# Patient Record
Sex: Male | Born: 1970
Health system: Southern US, Community
[De-identification: ages and names within clinical notes are randomized; demographics above are authoritative.]

## PROBLEM LIST (undated history)

## (undated) DIAGNOSIS — E079 Disorder of thyroid, unspecified: Secondary | ICD-10-CM

## (undated) DIAGNOSIS — M1990 Unspecified osteoarthritis, unspecified site: Secondary | ICD-10-CM

## (undated) DIAGNOSIS — T7840XA Allergy, unspecified, initial encounter: Secondary | ICD-10-CM

## (undated) DIAGNOSIS — E785 Hyperlipidemia, unspecified: Secondary | ICD-10-CM

## (undated) DIAGNOSIS — I10 Essential (primary) hypertension: Secondary | ICD-10-CM

## (undated) DIAGNOSIS — E039 Hypothyroidism, unspecified: Secondary | ICD-10-CM

## (undated) HISTORY — DX: Disorder of thyroid, unspecified: E07.9

## (undated) HISTORY — PX: FINGER FRACTURE SURGERY: SHX638

## (undated) HISTORY — DX: Hyperlipidemia, unspecified: E78.5

## (undated) HISTORY — DX: Allergy, unspecified, initial encounter: T78.40XA

## (undated) HISTORY — DX: Essential (primary) hypertension: I10

---

## 2004-11-17 ENCOUNTER — Ambulatory Visit: Payer: Self-pay | Admitting: Internal Medicine

## 2005-02-17 ENCOUNTER — Ambulatory Visit: Payer: Self-pay | Admitting: Internal Medicine

## 2005-12-12 ENCOUNTER — Ambulatory Visit: Payer: Self-pay | Admitting: Internal Medicine

## 2006-03-13 ENCOUNTER — Ambulatory Visit: Payer: Self-pay | Admitting: Internal Medicine

## 2006-05-31 ENCOUNTER — Ambulatory Visit: Payer: Self-pay | Admitting: Internal Medicine

## 2006-09-22 ENCOUNTER — Ambulatory Visit: Payer: Self-pay | Admitting: Internal Medicine

## 2007-12-11 ENCOUNTER — Ambulatory Visit: Payer: Self-pay | Admitting: Internal Medicine

## 2007-12-11 DIAGNOSIS — B359 Dermatophytosis, unspecified: Secondary | ICD-10-CM | POA: Insufficient documentation

## 2007-12-11 DIAGNOSIS — E785 Hyperlipidemia, unspecified: Secondary | ICD-10-CM | POA: Insufficient documentation

## 2007-12-11 DIAGNOSIS — E039 Hypothyroidism, unspecified: Secondary | ICD-10-CM | POA: Insufficient documentation

## 2008-03-04 ENCOUNTER — Ambulatory Visit: Payer: Self-pay | Admitting: Internal Medicine

## 2009-06-18 ENCOUNTER — Telehealth: Payer: Self-pay | Admitting: Internal Medicine

## 2009-07-01 ENCOUNTER — Ambulatory Visit: Payer: Self-pay | Admitting: Internal Medicine

## 2009-07-03 LAB — CONVERTED CEMR LAB
Albumin: 4.1 g/dL (ref 3.5–5.2)
Basophils Absolute: 0 10*3/uL (ref 0.0–0.1)
Calcium: 9.2 mg/dL (ref 8.4–10.5)
Chloride: 108 meq/L (ref 96–112)
Eosinophils Absolute: 0.1 10*3/uL (ref 0.0–0.7)
HCT: 40.2 % (ref 39.0–52.0)
Hemoglobin: 13.7 g/dL (ref 13.0–17.0)
Lymphs Abs: 1.1 10*3/uL (ref 0.7–4.0)
MCHC: 34.1 g/dL (ref 30.0–36.0)
Monocytes Relative: 6.7 % (ref 3.0–12.0)
Neutro Abs: 2.9 10*3/uL (ref 1.4–7.7)
Potassium: 4.5 meq/L (ref 3.5–5.1)
RDW: 12.6 % (ref 11.5–14.6)

## 2010-10-04 ENCOUNTER — Ambulatory Visit: Payer: Self-pay | Admitting: Internal Medicine

## 2010-10-04 DIAGNOSIS — J309 Allergic rhinitis, unspecified: Secondary | ICD-10-CM | POA: Insufficient documentation

## 2010-10-05 LAB — CONVERTED CEMR LAB
Cholesterol: 198 mg/dL (ref 0–200)
Glucose, Bld: 86 mg/dL (ref 70–99)
HDL: 62 mg/dL (ref >39.00)
Total CHOL/HDL Ratio: 3
Triglycerides: 45 mg/dL (ref 0.0–149.0)

## 2010-11-25 NOTE — Assessment & Plan Note (Signed)
Summary: CPX/REFILL MED/CLE   Vital Signs:  Patient profile:   40 year old male Height:      69 inches Weight:      216 pounds BMI:     32.01 Temp:     98.9 degrees F oral Pulse rate:   60 / minute Pulse rhythm:   regular BP sitting:   128 / 80  (left arm) Cuff size:   large  Vitals Entered By: Mervin Hack CMA Duncan Dull) (October 04, 2010 11:45 AM) CC: adult physical   History of Present Illness: Doing well Feels fine--no new complaints Does get some aches and pains  Now in new tire shop  better conditions  Allergies: No Known Drug Allergies  Past History:  Past medical, surgical, family and social histories (including risk factors) reviewed for relevance to current acute and chronic problems.  Past Medical History: Hyperlipidemia Hypothyroidism Allergic rhinitis  Past Surgical History: Reviewed history from 12/11/2007 and no changes required. 1980's Rgiht 2nd/3rd finger repair  Family History: Reviewed history from 12/11/2007 and no changes required. Dad died @56  Alcoholism, HTN Mom died @54  lymphoma 1 brother, 1 sister No CAD or DM No prostate or colon cancer  Social History: Occupation:  Retail buyer Married--2nd.  1 child, joint custody with mom Former Smoker Alcohol use-occ  Review of Systems General:  weight had been down and then back up again after Thanksgiving Has started jogging--has run 5K Not that careful with diet but has cut down on junk food doesn't generally drink calories Sleeps okay wears seat belt. Eyes:  Denies double vision and vision loss-1 eye. ENT:  Denies decreased hearing and ringing in ears; teeth okay---overdue for dentist. CV:  Denies chest pain or discomfort, difficulty breathing at night, difficulty breathing while lying down, fainting, lightheadness, palpitations, and shortness of breath with exertion. Resp:  Denies cough and shortness of breath. GI:  Denies abdominal pain, bloody stools, change in bowel  habits, dark tarry stools, indigestion, nausea, and vomiting. GU:  Denies erectile dysfunction, urinary frequency, and urinary hesitancy. MS:  Complains of muscle aches; denies joint pain and joint swelling; mild achiness. Derm:  Denies lesion(s) and rash. Neuro:  Complains of headaches; denies numbness and tingling; only occ headaches. Psych:  Denies anxiety and depression. Endo:  Denies cold intolerance and heat intolerance. Heme:  Denies abnormal bruising and enlarge lymph nodes. Allergy:  Complains of seasonal allergies and sneezing; gets post nasal drip and clears his throat does take meds regularly.  Physical Exam  General:  alert and normal appearance.   Eyes:  pupils equal, pupils round, pupils reactive to light, and no optic disk abnormalities.   Ears:  R ear normal and L ear normal.   Mouth:  no erythema, no exudates, and no lesions.   Neck:  supple, no masses, no thyromegaly, no carotid bruits, and no cervical lymphadenopathy.   Lungs:  normal respiratory effort, no intercostal retractions, no accessory muscle use, and normal breath sounds.   Heart:  normal rate, regular rhythm, no murmur, and no gallop.   Abdomen:  soft, non-tender, and no masses.   Msk:  no joint tenderness and no joint swelling.   Pulses:  1+ in feet Extremities:  no edema Neurologic:  alert & oriented X3, strength normal in all extremities, and gait normal.   Skin:  no rashes and no suspicious lesions.   Several benign nevi Axillary Nodes:  No palpable lymphadenopathy Psych:  normally interactive, good eye contact, not anxious appearing, and not  depressed appearing.     Impression & Recommendations:  Problem # 1:  PREVENTIVE HEALTH CARE (ICD-V70.0) Assessment Comment Only discussed fitness--needs to get back to running Due for Tdap check glucose  Problem # 2:  ALLERGIC RHINITIS (ICD-477.9) Assessment: Comment Only uses OTC meds as needed  will add fluticasone as needed   Problem # 3:   HYPERLIPIDEMIA (ICD-272.4) Assessment: Comment Only  will recheck no meds though  Orders: TLB-Lipid Panel (80061-LIPID)  Problem # 4:  HYPOTHYROIDISM (ICD-244.9) Assessment: Comment Only  due for labs  The following medications were removed from the medication list:    Levothyroxine Sodium 150 Mcg Tabs (Levothyroxine sodium) .[facility]... 1 tab daily His updated medication list for this problem includes:    Synthroid 150 Mcg Tabs (Levothyroxine sodium) .Marland Kitchen... Take 1 tablet by mouth once a day  Orders: TLB-TSH (Thyroid Stimulating Hormone) (84443-TSH) TLB-Glucose, QUANT (82947-GLU) TLB-T4 (Thyrox), Free [phone]) Venipuncture (13244)  Complete Medication List: 1)  Synthroid 150 Mcg Tabs (Levothyroxine sodium) .... Take 1 tablet by mouth once a day  Other Orders: Tdap => 41yrs IM (01027) Admin 1st Vaccine (25366)  Patient Instructions: 1)  Please schedule a follow-up appointment in 1 year for physical Prescriptions: SYNTHROID 150 MCG  TABS (LEVOTHYROXINE SODIUM) Take 1 tablet by mouth once a day  #90 x 3   Entered by:   Mervin Hack CMA (AAMA)   Authorized by:   Cindee Salt MD   Signed by:   Mervin Hack CMA (AAMA) on 10/04/2010   Method used:   [facility]        [facility]       [address]       [place], [place]  [address]       Ph: [phone]       Fax: [phone]   RxID:   [id number]    Orders Added: 1)  Est. Patient 40-64 years [99396] 2)  TLB-TSH (Thyroid Stimulating Hormone) [84443-TSH] 3)  TLB-Glucose, QUANT [82947-GLU] 4)  TLB-T4 (Thyrox), Free [23557-DU2G] 5)  Venipuncture [25427] 6)  TLB-Lipid Panel [80061-LIPID] 7)  Tdap => 68yrs IM [90715] 8)  Admin 1st Vaccine [06237]   Immunizations Administered:  Tetanus Vaccine:    Vaccine Type: Tdap    Site: left deltoid    Mfr: GlaxoSmithKline    Dose: 0.5 ml    Route: IM    Given by: Mervin Hack CMA (AAMA)    Exp.  Date: 08/12/2012    Lot #: [id number]    VIS given: 09/10/08 version given October 04, 2010.   Immunizations Administered:  Tetanus Vaccine:    Vaccine Type: Tdap    Site: left deltoid    Mfr: GlaxoSmithKline    Dose: 0.5 ml    Route: IM    Given by: Mervin Hack CMA (AAMA)    Exp. Date: 08/12/2012    Lot #: [id number]    VIS given: 09/10/08 version given October 04, 2010.  Current Allergies (reviewed today): No known allergies

## 2011-08-09 ENCOUNTER — Encounter: Payer: Self-pay | Admitting: Internal Medicine

## 2011-08-10 ENCOUNTER — Encounter: Payer: Self-pay | Admitting: Family Medicine

## 2011-08-10 ENCOUNTER — Ambulatory Visit (INDEPENDENT_AMBULATORY_CARE_PROVIDER_SITE_OTHER): Payer: BC Managed Care – PPO | Admitting: Family Medicine

## 2011-08-10 VITALS — BP 138/100 | HR 68 | Temp 98.0°F | Ht 68.5 in | Wt 222.8 lb

## 2011-08-10 DIAGNOSIS — I10 Essential (primary) hypertension: Secondary | ICD-10-CM

## 2011-08-10 DIAGNOSIS — E039 Hypothyroidism, unspecified: Secondary | ICD-10-CM

## 2011-08-10 LAB — BASIC METABOLIC PANEL
BUN: 17 mg/dL (ref 6–23)
CO2: 29 mEq/L (ref 19–32)
Chloride: 104 mEq/L (ref 96–112)
Creatinine, Ser: 0.9 mg/dL (ref 0.4–1.5)
Glucose, Bld: 87 mg/dL (ref 70–99)

## 2011-08-10 LAB — T4, FREE: Free T4: 1.12 ng/dL (ref 0.60–1.60)

## 2011-08-10 MED ORDER — HYDROCHLOROTHIAZIDE 12.5 MG PO CAPS: 12.5000 mg | ORAL_CAPSULE | Freq: Every day | ORAL | Status: DC

## 2011-08-10 NOTE — Assessment & Plan Note (Addendum)
New dx.  anticipate due to stress, increased salt recently. Discussed lifestyle changes, importance of weight loss. Check TSH today. EKG today for baseline - NSR 66 rate.  Nl axis, intervals.  RSR in V1 and V2 but criteria not met for bundle.  No acute ST/T changes. Start low dose diuretic, rtc 1 mo with PCP for f/u. Update if consistently running elevated.

## 2011-08-10 NOTE — Progress Notes (Signed)
  Subjective:    Patient ID: Victor Ford, male    DOB: September 18, 1971, 40 y.o.   MRN: [id number]  HPI CC: BP elevated.  40 yo with h/o HLD and hypothyroidism here today because elevated blood pressure at pre-work physical - yesterday 170/110, on recheck stayed elevated like that, max for work was 160/100.  Has never had elevated blood pressure.  Thinks combination of coffee that morning, eggs with salt, stress (put in 2 wks notice, but actually told not to return so currently without job).  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  Thinks has had elevated salt in diet recently as well.  No HA, vision changes, CP/tightness, SOB, leg swelling, urinary changes.  BP Readings from Last 3 Encounters:  08/10/11 140/100  10/04/10 128/80  07/01/09 110/80   Wt Readings from Last 3 Encounters:  08/10/11 222 lb 12 oz (101.039 kg)  10/04/10 216 lb (97.977 kg)  07/01/09 215 lb (97.523 kg)  has been 205lbs in past, as high as 300lbs in past.  Never HTN with this either.  Family History  Problem Relation Age of Onset  . Alcohol abuse Father   . Hypertension Father   . Diabetes Neg Hx   . Cancer Neg Hx   . Heart disease Neg Hx    Review of Systems Per HPI    Objective:   Physical Exam  Nursing note and vitals reviewed. Constitutional: He appears well-developed and well-nourished. No distress.  HENT:  Head: Normocephalic and atraumatic.  Mouth/Throat: Oropharynx is clear and moist. No oropharyngeal exudate.  Eyes: Conjunctivae and EOM are normal. Pupils are equal, round, and reactive to light. No scleral icterus.  Neck: Normal range of motion. Neck supple. Carotid bruit is not present.  Cardiovascular: Normal rate, regular rhythm, normal heart sounds and intact distal pulses.   No murmur heard. Pulmonary/Chest: Breath sounds normal. No respiratory distress. He has no wheezes. He has no rales.  Abdominal: Soft. Bowel sounds are normal. There is no tenderness.     No abd/renal bruits  Skin: Skin is warm and dry. No rash noted.      Assessment & Plan:

## 2011-08-10 NOTE — Patient Instructions (Addendum)
I'd like you to keep track of blood pressure for next several weeks (1-2x/wk).  Goal is <140/90.  If running consistently high, let us know. Back off salt (goal sodium 1500mg /day). More fruits and vegetables for potassium, more water in diet. Slowly start exercising more - weight loss will help with this as well. EKG done today. Start hydrochlorothiazide once daily (water pill), return to see [place] in 1 month. Form filled out today.

## 2011-08-10 NOTE — Assessment & Plan Note (Signed)
Check TSH.  Last check elevated but normal free T4. Lab Results  Component Value Date   TSH 8.71* 10/04/2010

## 2011-08-11 ENCOUNTER — Telehealth: Payer: Self-pay | Admitting: Family Medicine

## 2011-08-11 MED ORDER — LEVOTHYROXINE SODIUM 175 MCG PO TABS: 175.0000 ug | ORAL_TABLET | Freq: Every day | ORAL | Status: DC

## 2011-08-11 NOTE — Telephone Encounter (Signed)
please notify kidneys, sugar normal. Thyroid slightly low, would recommend increasing slightly - have sent in new dose to pharmacy. May take extra 1/2 pill of synthroid once a week until runs out, then fill new dose of daily. Make appt in 1-2 mo with PCP, would want to recheck TSH next visit and follow BP.

## 2011-08-11 NOTE — Telephone Encounter (Signed)
Patient notified and will make change in synthroid. He will start new Rx once he runs out of what he currently has. He will call back to schedule a follow up because he doesn't know what his schedule will be like at that time due to new job.

## 2011-12-08 ENCOUNTER — Encounter: Payer: Self-pay | Admitting: Internal Medicine

## 2011-12-08 ENCOUNTER — Ambulatory Visit (INDEPENDENT_AMBULATORY_CARE_PROVIDER_SITE_OTHER): Payer: PRIVATE HEALTH INSURANCE | Admitting: Internal Medicine

## 2011-12-08 VITALS — BP 128/80 | HR 60 | Temp 98.0°F | Ht 68.5 in | Wt 222.0 lb

## 2011-12-08 DIAGNOSIS — I10 Essential (primary) hypertension: Secondary | ICD-10-CM

## 2011-12-08 LAB — BASIC METABOLIC PANEL
BUN: 17 mg/dL (ref 6–23)
CO2: 30 mEq/L (ref 19–32)
Calcium: 9.3 mg/dL (ref 8.4–10.5)
Creatinine, Ser: 0.9 mg/dL (ref 0.4–1.5)
Glucose, Bld: 82 mg/dL (ref 70–99)

## 2011-12-08 MED ORDER — HYDROCHLOROTHIAZIDE 12.5 MG PO CAPS: 12.5000 mg | ORAL_CAPSULE | Freq: Every day | ORAL | Status: DC

## 2011-12-08 NOTE — Assessment & Plan Note (Signed)
BP Readings from Last 3 Encounters:  12/08/11 128/80  08/10/11 138/100  10/04/10 128/80   Better on med No side effects Discussed lifestyle changes---may be able to come off med

## 2011-12-08 NOTE — Progress Notes (Signed)
  Subjective:    Patient ID: Victor Ford, male    DOB: 08/26/1971, 41 y.o.   MRN: [id number]  HPI Doing okay  Is taking the HCTZ No problems with the med No dizziness or syncope No chest pain No sob or edema  Stress Only part time work at job---this is tough  Checks BP at times at home 145 systolic--has small cuff  Current Outpatient Prescriptions on File Prior to Visit  Medication Sig Dispense Refill  . hydrochlorothiazide (MICROZIDE) 12.5 MG capsule Take 1 capsule (12.5 mg total) by mouth daily.  30 capsule  3  . levothyroxine (SYNTHROID, LEVOTHROID) 175 MCG tablet Take 1 tablet (175 mcg total) by mouth daily.  30 tablet  11    No Known Allergies  Past Medical History  Diagnosis Date  . Hyperlipidemia   . Thyroid disease   . Allergy   . Hypertension     Past Surgical History  Procedure Date  . Finger fracture surgery     1980's Right 2nd/3rd finger repair    Family History  Problem Relation Age of Onset  . Alcohol abuse Father   . Hypertension Father   . Diabetes Neg Hx   . Cancer Neg Hx   . Heart disease Neg Hx     History   Social History  . Marital Status: Married    Spouse Name: N/A    Number of Children: 1  . Years of Education: N/A   Occupational History  . Education officer, museum   Social History Main Topics  . Smoking status: Former Games developer  . Smokeless tobacco: Never Used  . Alcohol Use: Yes     Regularly (1-2 per night)  . Drug Use: No  . Sexually Active: Not on file   Other Topics Concern  . Not on file   Social History Narrative  . No narrative on file   Review of Systems Sleeps okay Weight is stable    Objective:   Physical Exam  Constitutional: He appears well-developed and well-nourished. No distress.  Neck: Normal range of motion. Neck supple. No thyromegaly present.  Cardiovascular: Normal rate, regular rhythm and normal heart sounds.  Exam reveals no gallop.   No murmur heard. Pulmonary/Chest: Effort  normal and breath sounds normal. No respiratory distress. He has no wheezes. He has no rales.  Musculoskeletal: He exhibits no edema and no tenderness.  Lymphadenopathy:    He has no cervical adenopathy.  Psychiatric: He has a normal mood and affect. His behavior is normal. Judgment and thought content normal.          Assessment & Plan:

## 2012-01-17 ENCOUNTER — Other Ambulatory Visit: Payer: Self-pay | Admitting: Internal Medicine

## 2012-10-12 ENCOUNTER — Other Ambulatory Visit: Payer: Self-pay | Admitting: Internal Medicine

## 2012-10-15 ENCOUNTER — Other Ambulatory Visit: Payer: Self-pay | Admitting: Internal Medicine

## 2012-11-22 ENCOUNTER — Telehealth: Payer: Self-pay

## 2012-11-22 ENCOUNTER — Encounter: Payer: Self-pay | Admitting: Internal Medicine

## 2012-11-22 ENCOUNTER — Ambulatory Visit (INDEPENDENT_AMBULATORY_CARE_PROVIDER_SITE_OTHER): Payer: 59 | Admitting: Internal Medicine

## 2012-11-22 VITALS — BP 144/94 | HR 84 | Temp 99.1°F | Wt 246.0 lb

## 2012-11-22 DIAGNOSIS — E039 Hypothyroidism, unspecified: Secondary | ICD-10-CM

## 2012-11-22 DIAGNOSIS — I10 Essential (primary) hypertension: Secondary | ICD-10-CM

## 2012-11-22 DIAGNOSIS — J309 Allergic rhinitis, unspecified: Secondary | ICD-10-CM

## 2012-11-22 LAB — HEPATIC FUNCTION PANEL
AST: 22 U/L (ref 0–37)
Alkaline Phosphatase: 69 U/L (ref 39–117)
Bilirubin, Direct: 0 mg/dL (ref 0.0–0.3)
Total Bilirubin: 0.7 mg/dL (ref 0.3–1.2)

## 2012-11-22 LAB — CBC WITH DIFFERENTIAL/PLATELET
Basophils Absolute: 0 10*3/uL (ref 0.0–0.1)
Eosinophils Relative: 2 % (ref 0.0–5.0)
Lymphocytes Relative: 24.6 % (ref 12.0–46.0)
Monocytes Relative: 6.3 % (ref 3.0–12.0)
Platelets: 263 10*3/uL (ref 150.0–400.0)
RDW: 13.1 % (ref 11.5–14.6)
WBC: 6.3 10*3/uL (ref 4.5–10.5)

## 2012-11-22 LAB — TSH: TSH: 4.71 u[IU]/mL (ref 0.35–5.50)

## 2012-11-22 LAB — BASIC METABOLIC PANEL
BUN: 14 mg/dL (ref 6–23)
Calcium: 9.1 mg/dL (ref 8.4–10.5)
GFR: 92.74 mL/min (ref >60.00)
Glucose, Bld: 95 mg/dL (ref 70–99)
Potassium: 3.9 mEq/L (ref 3.5–5.1)
Sodium: 140 mEq/L (ref 135–145)

## 2012-11-22 NOTE — Assessment & Plan Note (Signed)
Will accept mildly elevated TSH if the free T4 still well in the normal range

## 2012-11-22 NOTE — Telephone Encounter (Signed)
Yes he should restart it That is probably why his BP was up---as well as his weight gain Send Rx

## 2012-11-22 NOTE — Telephone Encounter (Signed)
Victor Ford left v/m to update pt's record; pt was seen 11/22/12 and did not let Dr Alphonsus Sias know that pt stopped HCTZ diuretic 2 months ago. Pt thought HCTZ was causing weight gain. Should pt restart diuretic. Anne request call back to patient.Please advise.

## 2012-11-22 NOTE — Assessment & Plan Note (Signed)
Mild Does fine with the OTC meds

## 2012-11-22 NOTE — Progress Notes (Signed)
  Subjective:    Patient ID: Victor Ford, male    DOB: Apr 08, 1971, 42 y.o.   MRN: [id number]  HPI Here for follow up Lots of stress  FIL died Wife with medical issues  Has been through a few jobs Not as active at work Feels his diet is not bad Now has elliptical and is trying to do regularly  No chest pain No SOB No dizziness or sycnope  No allergy problems Just seasonal issues---uses OTC  No skin or hair changes No fatigue or energy changes Not positive of thyroid dose 10/12 dose increased but he thinks he is on the lower dose still  Current Outpatient Prescriptions on File Prior to Visit  Medication Sig Dispense Refill  . hydrochlorothiazide (MICROZIDE) 12.5 MG capsule Take 1 capsule (12.5 mg total) by mouth daily.  90 capsule  3  . levothyroxine (SYNTHROID, LEVOTHROID) 150 MCG tablet TAKE ONE TABLET BY MOUTH ONE TIME DAILY  90 tablet  1    No Known Allergies  Past Medical History  Diagnosis Date  . Hyperlipidemia   . Thyroid disease   . Allergy   . Hypertension     Past Surgical History  Procedure Date  . Finger fracture surgery     1980's Right 2nd/3rd finger repair    Family History  Problem Relation Age of Onset  . Alcohol abuse Father   . Hypertension Father   . Diabetes Neg Hx   . Cancer Neg Hx   . Heart disease Neg Hx     History   Social History  . Marital Status: Married    Spouse Name: N/A    Number of Children: 1  . Years of Education: N/A   Occupational History  . Service writer     [employer]   Social History Main Topics  . Smoking status: Former Games developer  . Smokeless tobacco: Never Used  . Alcohol Use: Yes     Comment: Regularly (1-2 per night)  . Drug Use: No  . Sexually Active: Not on file   Other Topics Concern  . Not on file   Social History Narrative  . No narrative on file   Review of Systems Does get some knee pain if goes on a treadmill-- mostly just with the cold weather Sleeps fine Weight up 24#!   Objective:   Physical Exam  Constitutional: He appears well-developed and well-nourished. No distress.  Neck: Normal range of motion. Neck supple. No thyromegaly present.  Cardiovascular: Normal rate and regular rhythm.  Exam reveals no gallop.   No murmur heard.      Slight valve squeak without murmur  Pulmonary/Chest: Effort normal and breath sounds normal. No respiratory distress. He has no wheezes. He has no rales.  Abdominal: Soft. There is no tenderness.  Musculoskeletal: He exhibits no edema and no tenderness.  Lymphadenopathy:    He has no cervical adenopathy.  Psychiatric: He has a normal mood and affect. His behavior is normal.          Assessment & Plan:

## 2012-11-22 NOTE — Assessment & Plan Note (Signed)
BP Readings from Last 3 Encounters:  11/22/12 144/94  12/08/11 128/80  08/10/11 138/100   Control has worsened with lapse in fitness and huge weight gain Will not change med but he needs to lose the weight again, etc Check labs

## 2012-11-23 NOTE — Telephone Encounter (Signed)
.  left message to have patient return my call.  

## 2012-11-26 ENCOUNTER — Encounter: Payer: Self-pay | Admitting: *Deleted

## 2012-11-26 MED ORDER — LEVOTHYROXINE SODIUM 150 MCG PO TABS: 150.0000 ug | ORAL_TABLET | Freq: Every day | ORAL | Status: DC

## 2012-11-26 NOTE — Telephone Encounter (Signed)
Spoke with wife and pt did stop HCTZ and doesn't want to restart, per wife pt lost 5lbs after he stopped HCTZ. Wife also states pt BP was probably up due to him eating [employer] before his appt? They will call if anything changes.

## 2012-11-26 NOTE — Telephone Encounter (Signed)
Spoke with wife about lab results, pt is taking 150 mcg of synthroid, and pt doesn't want to re-start HCTZ, should he still have labs done? Please advise. Per wife with the changes to both meds synthroid and HCTZ they made patient gain weight.

## 2012-11-26 NOTE — Telephone Encounter (Signed)
Tell them if he does what he should for fitness and proper eating, and loses the weight he should, his blood pressure should be fine The diuretic will generally make people lose a few pounds of fluid so I don't understand that. But his BP was only borderline elevated so okay to stay off No need to recheck labs till next physical

## 2012-11-27 NOTE — Telephone Encounter (Signed)
Spoke with patient's wife and advised results  

## 2012-12-04 ENCOUNTER — Other Ambulatory Visit: Payer: Self-pay | Admitting: Internal Medicine

## 2013-01-07 ENCOUNTER — Other Ambulatory Visit: Payer: Self-pay | Admitting: Internal Medicine

## 2013-11-28 ENCOUNTER — Encounter: Payer: 59 | Admitting: Internal Medicine

## 2013-12-09 ENCOUNTER — Encounter: Payer: 59 | Admitting: Internal Medicine

## 2013-12-12 ENCOUNTER — Encounter: Payer: 59 | Admitting: Internal Medicine

## 2013-12-19 ENCOUNTER — Encounter: Payer: 59 | Admitting: Internal Medicine

## 2014-01-02 ENCOUNTER — Ambulatory Visit: Payer: 59 | Admitting: Internal Medicine

## 2014-01-02 ENCOUNTER — Encounter: Payer: Self-pay | Admitting: Internal Medicine

## 2014-01-02 ENCOUNTER — Telehealth: Payer: Self-pay | Admitting: Internal Medicine

## 2014-01-02 ENCOUNTER — Ambulatory Visit (INDEPENDENT_AMBULATORY_CARE_PROVIDER_SITE_OTHER): Payer: 59 | Admitting: Internal Medicine

## 2014-01-02 VITALS — BP 136/90 | HR 78 | Temp 100.6°F | Wt 237.0 lb

## 2014-01-02 DIAGNOSIS — J029 Acute pharyngitis, unspecified: Secondary | ICD-10-CM

## 2014-01-02 DIAGNOSIS — R05 Cough: Secondary | ICD-10-CM

## 2014-01-02 DIAGNOSIS — R52 Pain, unspecified: Secondary | ICD-10-CM

## 2014-01-02 DIAGNOSIS — R509 Fever, unspecified: Secondary | ICD-10-CM

## 2014-01-02 DIAGNOSIS — J111 Influenza due to unidentified influenza virus with other respiratory manifestations: Secondary | ICD-10-CM

## 2014-01-02 DIAGNOSIS — R059 Cough, unspecified: Secondary | ICD-10-CM

## 2014-01-02 LAB — POCT INFLUENZA A/B
INFLUENZA A, POC: POSITIVE
Influenza B, POC: POSITIVE

## 2014-01-02 MED ORDER — OSELTAMIVIR PHOSPHATE 75 MG PO CAPS: 75.0000 mg | ORAL_CAPSULE | Freq: Two times a day (BID) | ORAL | Status: DC

## 2014-01-02 NOTE — Telephone Encounter (Signed)
Patient Information:  Caller Name: Lelon Frohlich  Phone: [phone]  Patient: Victor Ford, Victor Ford  Gender: Male  DOB: 04/16/1971  Age: 43 Years  PCP: Viviana Simpler [facility])  Office Follow Up:  Does the office need to follow up with this patient?: No  Instructions For The Office: N/A  RN Note:  Spouse states that she will drive pt to appt today at 13:00.  Home care advice and call back information given  Symptoms  Reason For Call & Symptoms: Spouse is calling about Cold sxs.  Pt has fever, 103.0.  Has appt at 13:00.  Sore throat, chest congestion, cough, HA dizziness, light headed.  Reviewed Health History In EMR: Yes  Reviewed Medications In EMR: Yes  Reviewed Allergies In EMR: Yes  Reviewed Surgeries / Procedures: Yes  Date of Onset of Symptoms: 12/30/2013  Any Fever: Yes  Fever Taken: Oral  Fever Time Of Reading: 10:00:00  Fever Last Reading: 103.0  Guideline(s) Used:  Sore Throat  Disposition Per Guideline:   See Today in Office  Reason For Disposition Reached:   Severe sore throat pain  Advice Given:  Call Back If:  You become worse.  Patient Will Follow Care Advice:  YES

## 2014-01-02 NOTE — Progress Notes (Signed)
Pre visit review using our clinic review tool, if applicable. No additional management support is needed unless otherwise documented below in the visit note. 

## 2014-01-02 NOTE — Progress Notes (Signed)
HPI  Pt presents to the clinic today with c/o cough, chest congestion, sore throat, fever, chills and body aches. He reports this started 3 days ago. He does have pain with swallowing. He has been running fevers up to 103 this am. He has tried OTC ibuprofen and delsym without much relief. He has not had his flu shot this year. He does have a history of allergies but denies breathing problems. He has not had sick contacts that he is aware of.  Review of Systems      Past Medical History  Diagnosis Date  . Hyperlipidemia   . Thyroid disease   . Allergy   . Hypertension     Family History  Problem Relation Age of Onset  . Alcohol abuse Father   . Hypertension Father   . Diabetes Neg Hx   . Cancer Neg Hx   . Heart disease Neg Hx     History   Social History  . Marital Status: Married    Spouse Name: N/A    Number of Children: 1  . Years of Education: N/A   Occupational History  . [employer] History Main Topics  . Smoking status: Former Research scientist (life sciences)  . Smokeless tobacco: Never Used  . Alcohol Use: Yes     Comment: occasional  . Drug Use: No  . Sexual Activity: Not on file   Other Topics Concern  . Not on file   Social History Narrative  . No narrative on file    No Known Allergies   Constitutional: Positive headache, fatigue and fever. Denies abrupt weight changes.  HEENT:  Positive sore throat. Denies eye redness, eye pain, pressure behind the eyes, facial pain, nasal congestion, ear pain, ringing in the ears, wax buildup, runny nose or bloody nose. Respiratory: Positive cough. Denies difficulty breathing or shortness of breath.  Cardiovascular: Denies chest pain, chest tightness, palpitations or swelling in the hands or feet.   No other specific complaints in a complete review of systems (except as listed in HPI above).  Objective:   BP 136/90  Pulse 78  Temp(Src) 100.6 F (38.1 C) (Oral)  Wt 237 lb (107.502 kg)  SpO2  96% Wt Readings from Last 3 Encounters:  01/02/14 237 lb (107.502 kg)  11/22/12 246 lb (111.585 kg)  12/08/11 222 lb (100.699 kg)     General: Appears his stated age, ill appearing in NAD. HEENT: Head: normal shape and size; Eyes: sclera white, no icterus, conjunctiva pink, PERRLA and EOMs intact; Ears: Tm's gray and intact, normal light reflex; Nose: mucosa pink and moist, septum midline; Throat/Mouth: + PND. Teeth present, mucosa erythematous and moist, no exudate noted, no lesions or ulcerations noted.  Neck:  Neck supple, trachea midline. No massses, lumps or thyromegaly present.  Cardiovascular: Normal rate and rhythm. S1,S2 noted.  No murmur, rubs or gallops noted. No JVD or BLE edema. No carotid bruits noted. Pulmonary/Chest: Normal effort and positive vesicular breath sounds. No respiratory distress. No wheezes, rales or ronchi noted.      Assessment & Plan:   Fever, chills, body aches and cough secondary to Influenza:  Rapid Flu: positive Get some rest and drink plenty of water Do salt water gargles for the sore throat Ibuprofen for pain/fever Wash hands thoroughly to prevent spreading the virus eRx for Tamiflu BID x 5 days Work note provided  RTC as needed or if symptoms persist.

## 2014-01-02 NOTE — Patient Instructions (Addendum)
Viral Infections °A virus is a type of germ. Viruses can cause: °· Minor sore throats. °· Aches and pains. °· Headaches. °· Runny nose. °· Rashes. °· Watery eyes. °· Tiredness. °· Coughs. °· Loss of appetite. °· Feeling sick to your stomach (nausea). °· Throwing up (vomiting). °· Watery poop (diarrhea). °HOME CARE  °· Only take medicines as told by your doctor. °· Drink enough water and fluids to keep your pee (urine) clear or pale yellow. Sports drinks are a good choice. °· Get plenty of rest and eat healthy. Soups and broths with crackers or rice are fine. °GET HELP RIGHT AWAY IF:  °· You have a very bad headache. °· You have shortness of breath. °· You have chest pain or neck pain. °· You have an unusual rash. °· You cannot stop throwing up. °· You have watery poop that does not stop. °· You cannot keep fluids down. °· You or your child has a temperature by mouth above 102° F (38.9° C), not controlled by medicine. °· Your baby is older than 3 months with a rectal temperature of 102° F (38.9° C) or higher. °· Your baby is 3 months old or younger with a rectal temperature of 100.4° F (38° C) or higher. °MAKE SURE YOU:  °· Understand these instructions. °· Will watch this condition. °· Will get help right away if you are not doing well or get worse. °Document Released: 09/22/2008 Document Revised: 01/02/2012 Document Reviewed: 02/15/2011 °ExitCare® Patient Information ©2014 [employer]. ° °

## 2014-01-02 NOTE — Addendum Note (Signed)
Addended by: Lurlean Nanny on: 01/02/2014 01:24 PM   Modules accepted: Orders

## 2014-01-14 ENCOUNTER — Ambulatory Visit (INDEPENDENT_AMBULATORY_CARE_PROVIDER_SITE_OTHER): Payer: 59 | Admitting: Internal Medicine

## 2014-01-14 ENCOUNTER — Encounter: Payer: Self-pay | Admitting: Internal Medicine

## 2014-01-14 VITALS — BP 118/90 | HR 68 | Temp 97.4°F | Ht 70.0 in | Wt 243.4 lb

## 2014-01-14 DIAGNOSIS — E039 Hypothyroidism, unspecified: Secondary | ICD-10-CM

## 2014-01-14 DIAGNOSIS — I10 Essential (primary) hypertension: Secondary | ICD-10-CM

## 2014-01-14 DIAGNOSIS — Z0000 Encounter for general adult medical examination without abnormal findings: Secondary | ICD-10-CM

## 2014-01-14 LAB — GLUCOSE, RANDOM: Glucose, Bld: 82 mg/dL (ref 70–99)

## 2014-01-14 LAB — T4, FREE: Free T4: 0.94 ng/dL (ref 0.60–1.60)

## 2014-01-14 LAB — TSH: TSH: 12.89 u[IU]/mL — ABNORMAL HIGH (ref 0.35–5.50)

## 2014-01-14 NOTE — Assessment & Plan Note (Signed)
Seems euthyroid ?Will check labs ?

## 2014-01-14 NOTE — Assessment & Plan Note (Signed)
Healthy but out of shape  Counseling done

## 2014-01-14 NOTE — Assessment & Plan Note (Signed)
BP Readings from Last 3 Encounters:  01/14/14 118/90  01/02/14 136/90  11/22/12 144/94   Good control without meds

## 2014-01-14 NOTE — Progress Notes (Signed)
Pre visit review using our clinic review tool, if applicable. No additional management support is needed unless otherwise documented below in the visit note. 

## 2014-01-14 NOTE — Patient Instructions (Signed)
Exercise to Lose Weight Exercise and a healthy diet may help you lose weight. Your doctor may suggest specific exercises. EXERCISE IDEAS AND TIPS  Choose low-cost things you enjoy doing, such as walking, bicycling, or exercising to workout videos.  Take stairs instead of the elevator.  Walk during your lunch break.  Park your car further away from work or school.  Go to a gym or an exercise class.  Start with 5 to 10 minutes of exercise each day. Build up to 30 minutes of exercise 4 to 6 days a week.  Wear shoes with good support and comfortable clothes.  Stretch before and after working out.  Work out until you breathe harder and your heart beats faster.  Drink extra water when you exercise.  Do not do so much that you hurt yourself, feel dizzy, or get very short of breath. Exercises that burn about 150 calories:  Running 1  miles in 15 minutes.  Playing volleyball for 45 to 60 minutes.  Washing and waxing a car for 45 to 60 minutes.  Playing touch football for 45 minutes.  Walking 1  miles in 35 minutes.  Pushing a stroller 1  miles in 30 minutes.  Playing basketball for 30 minutes.  Raking leaves for 30 minutes.  Bicycling 5 miles in 30 minutes.  Walking 2 miles in 30 minutes.  Dancing for 30 minutes.  Shoveling snow for 15 minutes.  Swimming laps for 20 minutes.  Walking up stairs for 15 minutes.  Bicycling 4 miles in 15 minutes.  Gardening for 30 to 45 minutes.  Jumping rope for 15 minutes.  Washing windows or floors for 45 to 60 minutes. Document Released: 11/12/2010 Document Revised: 01/02/2012 Document Reviewed: 11/12/2010 [facility] Patient Information 2014 [place], [place]. DASH Diet The DASH diet stands for "Dietary Approaches to Stop Hypertension." It is a healthy eating plan that has been shown to reduce high blood pressure (hypertension) in as little as 14 days, while also possibly providing other significant health benefits. These other  health benefits include reducing the risk of breast cancer after menopause and reducing the risk of type 2 diabetes, heart disease, colon cancer, and stroke. Health benefits also include weight loss and slowing kidney failure in patients with chronic kidney disease.  DIET GUIDELINES  Limit salt (sodium). Your diet should contain less than 1500 mg of sodium daily.  Limit refined or processed carbohydrates. Your diet should include mostly whole grains. Desserts and added sugars should be used sparingly.  Include small amounts of heart-healthy fats. These types of fats include nuts, oils, and tub margarine. Limit saturated and trans fats. These fats have been shown to be harmful in the body. CHOOSING FOODS  The following food groups are based on a 2000 calorie diet. See your Registered Dietitian for individual calorie needs. Grains and Grain Products (6 to 8 servings daily)  Eat More Often: Whole-wheat bread, brown rice, whole-grain or wheat pasta, quinoa, popcorn without added fat or salt (air popped).  Eat Less Often: White bread, white pasta, white rice, cornbread. Vegetables (4 to 5 servings daily)  Eat More Often: Fresh, frozen, and canned vegetables. Vegetables may be raw, steamed, roasted, or grilled with a minimal amount of fat.  Eat Less Often/Avoid: Creamed or fried vegetables. Vegetables in a cheese sauce. Fruit (4 to 5 servings daily)  Eat More Often: All fresh, canned (in natural juice), or frozen fruits. Dried fruits without added sugar. One hundred percent fruit juice ( cup [237 mL] daily).  Eat Less Often: Dried fruits with added sugar. Canned fruit in light or heavy syrup. [employer], Fish, and Poultry (2 servings or less daily. One serving is 3 to 4 oz [85-114 g]).  Eat More Often: Ninety percent or leaner ground beef, tenderloin, sirloin. Round cuts of beef, chicken breast, [place] breast. All fish. Grill, bake, or broil your meat. Nothing should be fried.  Eat Less  Often/Avoid: Fatty cuts of meat, [place], or chicken leg, thigh, or wing. Fried cuts of meat or fish. Dairy (2 to 3 servings)  Eat More Often: Low-fat or fat-free milk, low-fat plain or light yogurt, reduced-fat or part-skim cheese.  Eat Less Often/Avoid: Milk (whole, 2%).Whole milk yogurt. Full-fat cheeses. Nuts, Seeds, and Legumes (4 to 5 servings per week)  Eat More Often: All without added salt.  Eat Less Often/Avoid: Salted nuts and seeds, canned beans with added salt. Fats and Sweets (limited)  Eat More Often: Vegetable oils, tub margarines without trans fats, sugar-free gelatin. Mayonnaise and salad dressings.  Eat Less Often/Avoid: Coconut oils, palm oils, butter, stick margarine, cream, half and half, cookies, candy, pie. FOR MORE INFORMATION The Dash Diet Eating Plan: www.dashdiet.org Document Released: 09/29/2011 Document Revised: 01/02/2012 Document Reviewed: 09/29/2011 [facility] Patient Information 2014 [place], [place].

## 2014-01-14 NOTE — Progress Notes (Signed)
Subjective:    Patient ID: Victor Ford, male    DOB: 09/26/1971, 43 y.o.   MRN: [id number]  HPI Here for physical  No new concerns Weight is stable Back on weight watchers now---has lost 10# since starting Doing the elliptical  Same job Nothing new in [place] Still lingering cough from the flu  Current Outpatient Prescriptions on File Prior to Visit  Medication Sig Dispense Refill  . levothyroxine (SYNTHROID, LEVOTHROID) 150 MCG tablet TAKE ONE TABLET BY MOUTH ONE TIME DAILY  30 tablet  11   No current facility-administered medications on file prior to visit.    No Known Allergies  Past Medical History  Diagnosis Date  . Hyperlipidemia   . Thyroid disease   . Allergy   . Hypertension     Past Surgical History  Procedure Laterality Date  . Finger fracture surgery      1980's Right 2nd/3rd finger repair    Family History  Problem Relation Age of Onset  . Alcohol abuse Father   . Hypertension Father   . Diabetes Neg Hx   . Cancer Neg Hx   . Heart disease Neg Hx     History   Social History  . Marital Status: Married    Spouse Name: N/A    Number of Children: 1  . Years of Education: N/A   Occupational History  . [employer] History Main Topics  . Smoking status: Former Research scientist (life sciences)  . Smokeless tobacco: Never Used  . Alcohol Use: Yes     Comment: occasional  . Drug Use: No  . Sexual Activity: Not on file   Other Topics Concern  . Not on file   Social History Narrative  . No narrative on file   Review of Systems  Constitutional: Negative for fatigue and unexpected weight change.       Wears seat belt  HENT: Negative for dental problem, hearing loss and tinnitus.        Overdue for dentist   Eyes: Negative for visual disturbance.       No diplopia or unilateral vision loss  Respiratory: Negative for chest tightness and shortness of breath.   Cardiovascular: Negative for chest pain, palpitations and leg  swelling.  Gastrointestinal: Negative for nausea, vomiting, abdominal pain, constipation and blood in stool.       No heartburn  Genitourinary: Negative for urgency, frequency and difficulty urinating.       No sexual problems  Musculoskeletal: Negative for arthralgias, back pain and joint swelling.  Skin: Negative for rash.       No suspicious lesions  Allergic/Immunologic: Positive for environmental allergies. Negative for immunocompromised state.       Seasonal allergies--takes OTC  Neurological: Negative for dizziness, syncope, weakness, light-headedness, numbness and headaches.  Psychiatric/Behavioral: Negative for sleep disturbance and dysphoric mood. The patient is not nervous/anxious.        Objective:   Physical Exam  Constitutional: He is oriented to person, place, and time. He appears well-developed and well-nourished. No distress.  HENT:  Head: Normocephalic and atraumatic.  Right Ear: External ear normal.  Left Ear: External ear normal.  Mouth/Throat: Oropharynx is clear and moist. No oropharyngeal exudate.  Eyes: Conjunctivae and EOM are normal. Pupils are equal, round, and reactive to light.  Neck: Normal range of motion. Neck supple. No thyromegaly present.  Cardiovascular: Normal rate, regular rhythm, normal heart sounds and intact distal pulses.  Exam reveals  no gallop.   No murmur heard. Pulmonary/Chest: Effort normal and breath sounds normal. No respiratory distress. He has no wheezes. He has no rales.  Abdominal: Soft. There is no tenderness.  Musculoskeletal: He exhibits no edema and no tenderness.  Lymphadenopathy:    He has no cervical adenopathy.  Neurological: He is alert and oriented to person, place, and time.  Skin: No rash noted. No erythema.  Psychiatric: He has a normal mood and affect. His behavior is normal.          Assessment & Plan:

## 2014-01-15 ENCOUNTER — Telehealth: Payer: Self-pay | Admitting: Internal Medicine

## 2014-01-15 NOTE — Telephone Encounter (Signed)
Relevant patient education mailed to patient.  

## 2014-01-28 ENCOUNTER — Encounter: Payer: Self-pay | Admitting: *Deleted

## 2014-01-28 MED ORDER — LEVOTHYROXINE SODIUM 175 MCG PO TABS: 175.0000 ug | ORAL_TABLET | Freq: Every day | ORAL | Status: DC

## 2014-02-02 ENCOUNTER — Other Ambulatory Visit: Payer: Self-pay | Admitting: Internal Medicine

## 2014-04-03 ENCOUNTER — Other Ambulatory Visit: Payer: Self-pay | Admitting: Internal Medicine

## 2014-04-04 ENCOUNTER — Other Ambulatory Visit: Payer: Self-pay | Admitting: Internal Medicine

## 2014-04-04 DIAGNOSIS — E039 Hypothyroidism, unspecified: Secondary | ICD-10-CM

## 2014-04-09 ENCOUNTER — Other Ambulatory Visit (INDEPENDENT_AMBULATORY_CARE_PROVIDER_SITE_OTHER): Payer: 59

## 2014-04-09 DIAGNOSIS — E039 Hypothyroidism, unspecified: Secondary | ICD-10-CM

## 2014-04-09 LAB — TSH: TSH: 1.22 u[IU]/mL (ref 0.35–4.50)

## 2014-04-09 LAB — T4, FREE: FREE T4: 1.32 ng/dL (ref 0.60–1.60)

## 2014-04-10 ENCOUNTER — Encounter: Payer: Self-pay | Admitting: *Deleted

## 2015-03-26 ENCOUNTER — Encounter: Payer: 59 | Admitting: Internal Medicine

## 2015-04-06 ENCOUNTER — Other Ambulatory Visit: Payer: Self-pay | Admitting: Internal Medicine

## 2015-10-02 ENCOUNTER — Ambulatory Visit (INDEPENDENT_AMBULATORY_CARE_PROVIDER_SITE_OTHER): Payer: Self-pay | Admitting: Internal Medicine

## 2015-10-02 ENCOUNTER — Encounter: Payer: Self-pay | Admitting: Internal Medicine

## 2015-10-02 VITALS — BP 158/90 | HR 68 | Temp 98.3°F | Ht 69.0 in | Wt 243.0 lb

## 2015-10-02 DIAGNOSIS — I10 Essential (primary) hypertension: Secondary | ICD-10-CM

## 2015-10-02 DIAGNOSIS — E039 Hypothyroidism, unspecified: Secondary | ICD-10-CM

## 2015-10-02 MED ORDER — LOSARTAN POTASSIUM-HCTZ 50-12.5 MG PO TABS: 1.0000 | ORAL_TABLET | Freq: Every day | ORAL | Status: DC

## 2015-10-02 NOTE — Progress Notes (Signed)
Pre visit review using our clinic review tool, if applicable. No additional management support is needed unless otherwise documented below in the visit note. 

## 2015-10-02 NOTE — Patient Instructions (Signed)
DASH Eating Plan  DASH stands for "Dietary Approaches to Stop Hypertension." The DASH eating plan is a healthy eating plan that has been shown to reduce high blood pressure (hypertension). Additional health benefits may include reducing the risk of type 2 diabetes mellitus, heart disease, and stroke. The DASH eating plan may also help with weight loss.  WHAT DO I NEED TO KNOW ABOUT THE DASH EATING PLAN?  For the DASH eating plan, you will follow these general guidelines:  · Choose foods with a percent daily value for sodium of less than 5% (as listed on the food label).  · Use salt-free seasonings or herbs instead of table salt or sea salt.  · Check with your health care provider or pharmacist before using salt substitutes.  · Eat lower-sodium products, often labeled as "lower sodium" or "no salt added."  · Eat fresh foods.  · Eat more vegetables, fruits, and low-fat dairy products.  · Choose whole grains. Look for the word "whole" as the first word in the ingredient list.  · Choose fish and skinless chicken or turkey more often than red meat. Limit fish, poultry, and meat to 6 oz (170 g) each day.  · Limit sweets, desserts, sugars, and sugary drinks.  · Choose heart-healthy fats.  · Limit cheese to 1 oz (28 g) per day.  · Eat more home-cooked food and less restaurant, buffet, and fast food.  · Limit fried foods.  · Cook foods using methods other than frying.  · Limit canned vegetables. If you do use them, rinse them well to decrease the sodium.  · When eating at a restaurant, ask that your food be prepared with less salt, or no salt if possible.  WHAT FOODS CAN I EAT?  Seek help from a dietitian for individual calorie needs.  Grains  Whole grain or whole wheat bread. Brown rice. Whole grain or whole wheat pasta. Quinoa, bulgur, and whole grain cereals. Low-sodium cereals. Corn or whole wheat flour tortillas. Whole grain cornbread. Whole grain crackers. Low-sodium crackers.  Vegetables  Fresh or frozen vegetables  (raw, steamed, roasted, or grilled). Low-sodium or reduced-sodium tomato and vegetable juices. Low-sodium or reduced-sodium tomato sauce and paste. Low-sodium or reduced-sodium canned vegetables.   Fruits  All fresh, canned (in natural juice), or frozen fruits.  Meat and Other Protein Products  Ground beef (85% or leaner), grass-fed beef, or beef trimmed of fat. Skinless chicken or turkey. Ground chicken or turkey. Pork trimmed of fat. All fish and seafood. Eggs. Dried beans, peas, or lentils. Unsalted nuts and seeds. Unsalted canned beans.  Dairy  Low-fat dairy products, such as skim or 1% milk, 2% or reduced-fat cheeses, low-fat ricotta or cottage cheese, or plain low-fat yogurt. Low-sodium or reduced-sodium cheeses.  Fats and Oils  Tub margarines without trans fats. Light or reduced-fat mayonnaise and salad dressings (reduced sodium). Avocado. Safflower, olive, or canola oils. Natural peanut or almond butter.  Other  Unsalted popcorn and pretzels.  The items listed above may not be a complete list of recommended foods or beverages. Contact your dietitian for more options.  WHAT FOODS ARE NOT RECOMMENDED?  Grains  White bread. White pasta. White rice. Refined cornbread. Bagels and croissants. Crackers that contain trans fat.  Vegetables  Creamed or fried vegetables. Vegetables in a cheese sauce. Regular canned vegetables. Regular canned tomato sauce and paste. Regular tomato and vegetable juices.  Fruits  Dried fruits. Canned fruit in light or heavy syrup. Fruit juice.  Meat and Other Protein   Products  Fatty cuts of meat. Ribs, chicken wings, bacon, sausage, bologna, salami, chitterlings, fatback, hot dogs, bratwurst, and packaged luncheon meats. Salted nuts and seeds. Canned beans with salt.  Dairy  Whole or 2% milk, cream, half-and-half, and cream cheese. Whole-fat or sweetened yogurt. Full-fat cheeses or blue cheese. Nondairy creamers and whipped toppings. Processed cheese, cheese spreads, or cheese  curds.  Condiments  Onion and garlic salt, seasoned salt, table salt, and sea salt. Canned and packaged gravies. Worcestershire sauce. Tartar sauce. Barbecue sauce. Teriyaki sauce. Soy sauce, including reduced sodium. Steak sauce. Fish sauce. Oyster sauce. Cocktail sauce. Horseradish. Ketchup and mustard. Meat flavorings and tenderizers. Bouillon cubes. Hot sauce. Tabasco sauce. Marinades. Taco seasonings. Relishes.  Fats and Oils  Butter, stick margarine, lard, shortening, ghee, and bacon fat. Coconut, palm kernel, or palm oils. Regular salad dressings.  Other  Pickles and olives. Salted popcorn and pretzels.  The items listed above may not be a complete list of foods and beverages to avoid. Contact your dietitian for more information.  WHERE CAN I FIND MORE INFORMATION?  National Heart, Lung, and Blood Institute: www.nhlbi.nih.gov/health/health-topics/topics/dash/     This information is not intended to replace advice given to you by your health care provider. Make sure you discuss any questions you have with your health care provider.     Document Released: 09/29/2011 Document Revised: 10/31/2014 Document Reviewed: 08/14/2013  Elsevier Interactive Patient Education ©2016 Elsevier Inc.

## 2015-10-02 NOTE — Assessment & Plan Note (Signed)
Seems euthyroid Will recheck labs at his follow up

## 2015-10-02 NOTE — Progress Notes (Signed)
   Subjective:    Patient ID: Victor Ford, male    DOB: 11-Aug-1971, 44 y.o.   MRN: [id number]  HPI Here for physical--but no insurance Lost job recently--looking for work now  No new concerns  Current Outpatient Prescriptions on File Prior to Visit  Medication Sig Dispense Refill  . levothyroxine (SYNTHROID, LEVOTHROID) 175 MCG tablet TAKE ONE TABLET BY MOUTH ONE TIME DAILY BEFORE BREAKFAST 90 tablet 3   No current facility-administered medications on file prior to visit.    No Known Allergies  Past Medical History  Diagnosis Date  . Hyperlipidemia   . Thyroid disease   . Allergy   . Hypertension     Past Surgical History  Procedure Laterality Date  . Finger fracture surgery      1980's Right 2nd/3rd finger repair    Family History  Problem Relation Age of Onset  . Alcohol abuse Father   . Hypertension Father   . Diabetes Neg Hx   . Cancer Neg Hx   . Heart disease Neg Hx     Social History   Social History  . Marital Status: Married    Spouse Name: N/A  . Number of Children: 1  . Years of Education: N/A   Occupational History  . Service writer--unemployed now         Social History Main Topics  . Smoking status: Former Research scientist (life sciences)  . Smokeless tobacco: Never Used  . Alcohol Use: Yes     Comment: occasional  . Drug Use: No  . Sexual Activity: Not on file   Other Topics Concern  . Not on file   Social History Narrative    Review of Systems Weight is stable Trying to walk daily since being laid off No chest pain No SOB No dizziness or syncope Bowels are fine No urinary problems    Objective:   Physical Exam  Constitutional: He appears well-developed and well-nourished. No distress.  Neck: Normal range of motion. Neck supple. No thyromegaly present.  Cardiovascular: Normal rate, regular rhythm, normal heart sounds and intact distal pulses.  Exam reveals no gallop.   No murmur heard. Pulmonary/Chest: Effort normal and breath sounds normal.  No respiratory distress. He has no wheezes. He has no rales.  Abdominal: Soft. There is no tenderness.  Musculoskeletal: He exhibits no edema or tenderness.  Lymphadenopathy:    He has no cervical adenopathy.  Psychiatric: He has a normal mood and affect. His behavior is normal.          Assessment & Plan:

## 2015-10-02 NOTE — Assessment & Plan Note (Signed)
BP Readings from Last 3 Encounters:  10/02/15 158/90  01/14/14 118/90  01/02/14 136/90   Repeat 154/100 on right  Will start med and see back in 2 months Discussed lifestyle

## 2015-12-08 ENCOUNTER — Ambulatory Visit: Payer: Self-pay | Admitting: Internal Medicine

## 2016-01-20 ENCOUNTER — Ambulatory Visit (INDEPENDENT_AMBULATORY_CARE_PROVIDER_SITE_OTHER): Payer: Managed Care, Other (non HMO) | Admitting: Internal Medicine

## 2016-01-20 ENCOUNTER — Encounter: Payer: Self-pay | Admitting: Internal Medicine

## 2016-01-20 VITALS — BP 118/88 | HR 80 | Temp 97.5°F | Wt 247.0 lb

## 2016-01-20 DIAGNOSIS — I10 Essential (primary) hypertension: Secondary | ICD-10-CM | POA: Diagnosis not present

## 2016-01-20 DIAGNOSIS — E039 Hypothyroidism, unspecified: Secondary | ICD-10-CM | POA: Diagnosis not present

## 2016-01-20 LAB — RENAL FUNCTION PANEL
Albumin: 4.1 g/dL (ref 3.5–5.2)
BUN: 19 mg/dL (ref 6–23)
CO2: 31 mEq/L (ref 19–32)
Calcium: 9 mg/dL (ref 8.4–10.5)
Chloride: 104 mEq/L (ref 96–112)
Creatinine, Ser: 0.85 mg/dL (ref 0.40–1.50)
GFR: 102.7 mL/min (ref >60.00)
Glucose, Bld: 99 mg/dL (ref 70–99)
POTASSIUM: 4.3 meq/L (ref 3.5–5.1)
Phosphorus: 3.9 mg/dL (ref 2.3–4.6)
SODIUM: 139 meq/L (ref 135–145)

## 2016-01-20 LAB — CBC WITH DIFFERENTIAL/PLATELET
Basophils Absolute: 0 10*3/uL (ref 0.0–0.1)
Basophils Relative: 0.5 % (ref 0.0–3.0)
EOS ABS: 0.1 10*3/uL (ref 0.0–0.7)
EOS PCT: 2.8 % (ref 0.0–5.0)
HCT: 41.1 % (ref 39.0–52.0)
HEMOGLOBIN: 14 g/dL (ref 13.0–17.0)
Lymphocytes Relative: 21.9 % (ref 12.0–46.0)
Lymphs Abs: 1.1 10*3/uL (ref 0.7–4.0)
MCHC: 34.1 g/dL (ref 30.0–36.0)
MCV: 85.2 fl (ref 78.0–100.0)
Monocytes Absolute: 0.4 10*3/uL (ref 0.1–1.0)
Monocytes Relative: 6.8 % (ref 3.0–12.0)
Neutro Abs: 3.5 10*3/uL (ref 1.4–7.7)
Neutrophils Relative %: 68 % (ref 43.0–77.0)
Platelets: 224 10*3/uL (ref 150.0–400.0)
RBC: 4.83 Mil/uL (ref 4.22–5.81)
RDW: 13.1 % (ref 11.5–15.5)
WBC: 5.2 10*3/uL (ref 4.0–10.5)

## 2016-01-20 LAB — T4, FREE: Free T4: 1.24 ng/dL (ref 0.60–1.60)

## 2016-01-20 LAB — TSH: TSH: 1.81 u[IU]/mL (ref 0.35–4.50)

## 2016-01-20 NOTE — Assessment & Plan Note (Signed)
Seems euthyroid ?Will check labs ?

## 2016-01-20 NOTE — Progress Notes (Signed)
Pre visit review using our clinic review tool, if applicable. No additional management support is needed unless otherwise documented below in the visit note. 

## 2016-01-20 NOTE — Assessment & Plan Note (Signed)
BP Readings from Last 3 Encounters:  01/20/16 118/88  10/02/15 158/90  01/14/14 118/90   Great response with med and tolerating Due for labs

## 2016-01-20 NOTE — Progress Notes (Signed)
   Subjective:    Patient ID: Victor Ford, male    DOB: [birth year], 45 y.o.   MRN: [id number]  HPI Here for follow up of HTN  No problems with the medication Hasn't checked BP No headaches No dizziness No chest pain or SOB No edema  Now employed again--at [employer]  Weight is up 4# No change in hair,nails or skin Energy levels are okay  Current Outpatient Prescriptions on File Prior to Visit  Medication Sig Dispense Refill  . levothyroxine (SYNTHROID, LEVOTHROID) 175 MCG tablet TAKE ONE TABLET BY MOUTH ONE TIME DAILY BEFORE BREAKFAST 90 tablet 3  . losartan-hydrochlorothiazide (HYZAAR) 50-12.5 MG tablet Take 1 tablet by mouth daily. 90 tablet 3   No current facility-administered medications on file prior to visit.    No Known Allergies  Past Medical History  Diagnosis Date  . Hyperlipidemia   . Thyroid disease   . Allergy   . Hypertension     Past Surgical History  Procedure Laterality Date  . Finger fracture surgery      1980's Right 2nd/3rd finger repair    Family History  Problem Relation Age of Onset  . Alcohol abuse Father   . Hypertension Father   . Diabetes Neg Hx   . Cancer Neg Hx   . Heart disease Neg Hx     Social History   Social History  . Marital Status: Married    Spouse Name: N/A  . Number of Children: 1  . Years of Education: N/A   Occupational History  . Service writer--unemployed now         Social History Main Topics  . Smoking status: Former Research scientist (life sciences)  . Smokeless tobacco: Never Used  . Alcohol Use: Yes     Comment: occasional  . Drug Use: No  . Sexual Activity: Not on file   Other Topics Concern  . Not on file   Social History Narrative   Review of Systems  Has cut back on salt and eating more salads Info given on DASH Trying to walk every night     Objective:   Physical Exam  Constitutional: He appears well-developed and well-nourished. No distress.  Neck: Normal range of motion. Neck supple. No  thyromegaly present.  Cardiovascular: Normal rate, regular rhythm and normal heart sounds.  Exam reveals no gallop.   No murmur heard. Pulmonary/Chest: Effort normal and breath sounds normal. No respiratory distress. He has no wheezes. He has no rales.  Musculoskeletal: He exhibits no edema.  Lymphadenopathy:    He has no cervical adenopathy.          Assessment & Plan:

## 2016-03-25 ENCOUNTER — Other Ambulatory Visit: Payer: Self-pay | Admitting: Internal Medicine

## 2016-09-15 ENCOUNTER — Other Ambulatory Visit: Payer: Self-pay | Admitting: Internal Medicine

## 2016-12-14 ENCOUNTER — Other Ambulatory Visit: Payer: Self-pay | Admitting: Internal Medicine

## 2017-01-25 ENCOUNTER — Encounter: Payer: Managed Care, Other (non HMO) | Admitting: Internal Medicine

## 2017-03-12 ENCOUNTER — Other Ambulatory Visit: Payer: Self-pay | Admitting: Internal Medicine

## 2017-04-04 ENCOUNTER — Encounter: Payer: Self-pay | Admitting: Internal Medicine

## 2017-04-04 ENCOUNTER — Ambulatory Visit (INDEPENDENT_AMBULATORY_CARE_PROVIDER_SITE_OTHER): Payer: Self-pay | Admitting: Internal Medicine

## 2017-04-04 VITALS — BP 130/86 | HR 71 | Temp 98.1°F | Ht 68.0 in | Wt 240.0 lb

## 2017-04-04 DIAGNOSIS — Z0000 Encounter for general adult medical examination without abnormal findings: Secondary | ICD-10-CM

## 2017-04-04 DIAGNOSIS — E039 Hypothyroidism, unspecified: Secondary | ICD-10-CM

## 2017-04-04 DIAGNOSIS — I10 Essential (primary) hypertension: Secondary | ICD-10-CM

## 2017-04-04 LAB — TSH: TSH: 4.92 u[IU]/mL — AB (ref 0.35–4.50)

## 2017-04-04 LAB — COMPREHENSIVE METABOLIC PANEL
ALBUMIN: 4.1 g/dL (ref 3.5–5.2)
ALK PHOS: 70 U/L (ref 39–117)
ALT: 38 U/L (ref 0–53)
AST: 25 U/L (ref 0–37)
BUN: 20 mg/dL (ref 6–23)
CALCIUM: 9.1 mg/dL (ref 8.4–10.5)
CHLORIDE: 106 meq/L (ref 96–112)
CO2: 31 mEq/L (ref 19–32)
Creatinine, Ser: 0.8 mg/dL (ref 0.40–1.50)
GFR: 109.58 mL/min (ref >60.00)
Glucose, Bld: 92 mg/dL (ref 70–99)
POTASSIUM: 4.3 meq/L (ref 3.5–5.1)
SODIUM: 140 meq/L (ref 135–145)
Total Bilirubin: 0.4 mg/dL (ref 0.2–1.2)
Total Protein: 6.9 g/dL (ref 6.0–8.3)

## 2017-04-04 LAB — LIPID PANEL
Cholesterol: 168 mg/dL (ref 0–200)
HDL: 50.3 mg/dL (ref >39.00)
LDL Cholesterol: 82 mg/dL (ref 0–99)
NonHDL: 117.41
TRIGLYCERIDES: 176 mg/dL — AB (ref 0.0–149.0)
Total CHOL/HDL Ratio: 3
VLDL: 35.2 mg/dL (ref 0.0–40.0)

## 2017-04-04 LAB — CBC WITH DIFFERENTIAL/PLATELET
Basophils Absolute: 0.1 10*3/uL (ref 0.0–0.1)
Basophils Relative: 1.1 % (ref 0.0–3.0)
EOS PCT: 2.3 % (ref 0.0–5.0)
Eosinophils Absolute: 0.1 10*3/uL (ref 0.0–0.7)
HEMATOCRIT: 39.7 % (ref 39.0–52.0)
HEMOGLOBIN: 13.5 g/dL (ref 13.0–17.0)
LYMPHS PCT: 22.5 % (ref 12.0–46.0)
Lymphs Abs: 1.4 10*3/uL (ref 0.7–4.0)
MCHC: 34 g/dL (ref 30.0–36.0)
MCV: 87.4 fl (ref 78.0–100.0)
Monocytes Absolute: 0.4 10*3/uL (ref 0.1–1.0)
Monocytes Relative: 7.2 % (ref 3.0–12.0)
Neutro Abs: 4.1 10*3/uL (ref 1.4–7.7)
Neutrophils Relative %: 66.9 % (ref 43.0–77.0)
Platelets: 254 10*3/uL (ref 150.0–400.0)
RBC: 4.55 Mil/uL (ref 4.22–5.81)
RDW: 13.7 % (ref 11.5–15.5)
WBC: 6.2 10*3/uL (ref 4.0–10.5)

## 2017-04-04 LAB — T4, FREE: Free T4: 1.06 ng/dL (ref 0.60–1.60)

## 2017-04-04 NOTE — Progress Notes (Signed)
Subjective:    Patient ID: Victor Ford, male    DOB: [birth year], 46 y.o.   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 levothyroxine (SYNTHROID, LEVOTHROID) 175 MCG tablet TAKE ONE TABLET BY MOUTH ONE TIME DAILY BEFORE BREAKFAST 90 tablet 0  . losartan-hydrochlorothiazide (HYZAAR) 50-12.5 MG tablet TAKE 1 TABLET BY MOUTH DAILY. 90 tablet 2   No current facility-administered medications on file prior to visit.     No Known Allergies  Past Medical History:  Diagnosis Date  . Allergy   . Hyperlipidemia   . Hypertension   . Thyroid disease     Past Surgical History:  Procedure Laterality Date  . FINGER FRACTURE SURGERY     1980's Right 2nd/3rd finger repair    Family History  Problem Relation Age of Onset  . Alcohol abuse Father   . Hypertension Father   . Dementia Paternal Uncle   . Diabetes Neg Hx   . Cancer Neg Hx   . Heart disease Neg Hx     Social History   Social History  . Marital status: Married    Spouse name: N/A  . Number of children: 1  . Years of education: N/A   Occupational History  . 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 Smoking status: Former Research scientist (life sciences)  . Smokeless tobacco: Never Used  . Alcohol use Yes     Comment: occasional  . Drug use: No  . Sexual activity: Not on file   Other Topics Concern  . Not on file   Social History Narrative  . No narrative on file   Review of Systems  Constitutional: Negative for fatigue and unexpected weight change.       Wears seat belt  HENT: Negative for dental problem, hearing loss and tinnitus.        Keeps up with dentist  Eyes: Negative for visual disturbance.       No diplopia or unilateral vision loss    Respiratory: Negative for cough, chest tightness and shortness of breath.   Gastrointestinal: Negative for abdominal pain, blood in stool, constipation and nausea.  Endocrine: Negative for polydipsia and polyuria.  Genitourinary: Negative for difficulty urinating, frequency and urgency.       No ED  Musculoskeletal: Negative for back pain and joint swelling.       Rare knee pain  Skin: Negative for rash.       No suspicious lesions  Allergic/Immunologic: Positive for environmental allergies. Negative for immunocompromised state.       Uses OTC meds prn  Neurological: Negative for dizziness, syncope, light-headedness and headaches.  Hematological: Negative for adenopathy. Does not bruise/bleed easily.  Psychiatric/Behavioral: Negative for dysphoric mood and sleep disturbance. The patient is not nervous/anxious.        Objective:   Physical Exam  Constitutional: He is oriented to person, place, and time. He appears well-developed and well-nourished. No distress.  HENT:  Head: Normocephalic.  Right Ear: External ear normal.  Mouth/Throat: Oropharynx is clear and moist. No oropharyngeal exudate.  Eyes: Conjunctivae are normal. Pupils are equal, round, and reactive to light.  Neck: Normal range of motion. Neck supple. No thyromegaly present.  Cardiovascular: Normal rate, regular rhythm, normal heart  sounds and intact distal pulses.  Exam reveals no gallop.   No murmur heard. Pulmonary/Chest: Effort normal and breath sounds normal. No respiratory distress. He has no wheezes. He has no rales.  Abdominal: Soft. There is no tenderness.  Musculoskeletal: He exhibits no edema or tenderness.  Lymphadenopathy:    He has no cervical adenopathy.  Neurological: He is alert and oriented to person, place, and time.  Skin: No rash noted. No erythema.  Psychiatric: He has a normal mood and affect. His behavior is normal.          Assessment & Plan:

## 2017-04-04 NOTE — Patient Instructions (Signed)
DASH Eating Plan DASH stands for "Dietary Approaches to Stop Hypertension." The DASH eating plan is a healthy eating plan that has been shown to reduce high blood pressure (hypertension). It may also reduce your risk for type 2 diabetes, heart disease, and stroke. The DASH eating plan may also help with weight loss. What are tips for following this plan? General guidelines  Avoid eating more than 2,300 mg (milligrams) of salt (sodium) a day. If you have hypertension, you may need to reduce your sodium intake to 1,500 mg a day.  Limit alcohol intake to no more than 1 drink a day for nonpregnant women and 2 drinks a day for men. One drink equals 12 oz of beer, 5 oz of wine, or 1 oz of hard liquor.  Work with your health care provider to maintain a healthy body weight or to lose weight. Ask what an ideal weight is for you.  Get at least 30 minutes of exercise that causes your heart to beat faster (aerobic exercise) most days of the week. Activities may include walking, swimming, or biking.  Work with your health care provider or diet and nutrition specialist (dietitian) to adjust your eating plan to your individual calorie needs. Reading food labels  Check food labels for the amount of sodium per serving. Choose foods with less than 5 percent of the Daily Value of sodium. Generally, foods with less than 300 mg of sodium per serving fit into this eating plan.  To find whole grains, look for the word "whole" as the first word in the ingredient list. Shopping  Buy products labeled as "low-sodium" or "no salt added."  Buy fresh foods. Avoid canned foods and premade or frozen meals. Cooking  Avoid adding salt when cooking. Use salt-free seasonings or herbs instead of table salt or sea salt. Check with your health care provider or pharmacist before using salt substitutes.  Do not fry foods. Cook foods using healthy methods such as baking, boiling, grilling, and broiling instead.  Cook with  heart-healthy oils, such as olive, canola, soybean, or sunflower oil. Meal planning   Eat a balanced diet that includes: ? 5 or more servings of fruits and vegetables each day. At each meal, try to fill half of your plate with fruits and vegetables. ? Up to 6-8 servings of whole grains each day. ? Less than 6 oz of lean meat, poultry, or fish each day. A 3-oz serving of meat is about the same size as a deck of cards. One egg equals 1 oz. ? 2 servings of low-fat dairy each day. ? A serving of nuts, seeds, or beans 5 times each week. ? Heart-healthy fats. Healthy fats called Omega-3 fatty acids are found in foods such as flaxseeds and coldwater fish, like sardines, salmon, and mackerel.  Limit how much you eat of the following: ? Canned or prepackaged foods. ? Food that is high in trans fat, such as fried foods. ? Food that is high in saturated fat, such as fatty meat. ? Sweets, desserts, sugary drinks, and other foods with added sugar. ? Full-fat dairy products.  Do not salt foods before eating.  Try to eat at least 2 vegetarian meals each week.  Eat more home-cooked food and less restaurant, buffet, and fast food.  When eating at a restaurant, ask that your food be prepared with less salt or no salt, if possible. What foods are recommended? The items listed may not be a complete list. Talk with your dietitian about what   dietary choices are best for you. Grains Whole-grain or whole-wheat bread. Whole-grain or whole-wheat pasta. Brown rice. Oatmeal. Quinoa. Bulgur. Whole-grain and low-sodium cereals. Pita bread. Low-fat, low-sodium crackers. Whole-wheat flour tortillas. Vegetables Fresh or frozen vegetables (raw, steamed, roasted, or grilled). Low-sodium or reduced-sodium tomato and vegetable juice. Low-sodium or reduced-sodium tomato sauce and tomato paste. Low-sodium or reduced-sodium canned vegetables. Fruits All fresh, dried, or frozen fruit. Canned fruit in natural juice (without  added sugar). Meat and other protein foods Skinless chicken or turkey. Ground chicken or turkey. Pork with fat trimmed off. Fish and seafood. Egg whites. Dried beans, peas, or lentils. Unsalted nuts, nut butters, and seeds. Unsalted canned beans. Lean cuts of beef with fat trimmed off. Low-sodium, lean deli meat. Dairy Low-fat (1%) or fat-free (skim) milk. Fat-free, low-fat, or reduced-fat cheeses. Nonfat, low-sodium ricotta or cottage cheese. Low-fat or nonfat yogurt. Low-fat, low-sodium cheese. Fats and oils Soft margarine without trans fats. Vegetable oil. Low-fat, reduced-fat, or light mayonnaise and salad dressings (reduced-sodium). Canola, safflower, olive, soybean, and sunflower oils. Avocado. Seasoning and other foods Herbs. Spices. Seasoning mixes without salt. Unsalted popcorn and pretzels. Fat-free sweets. What foods are not recommended? The items listed may not be a complete list. Talk with your dietitian about what dietary choices are best for you. Grains Baked goods made with fat, such as croissants, muffins, or some breads. Dry pasta or rice meal packs. Vegetables Creamed or fried vegetables. Vegetables in a cheese sauce. Regular canned vegetables (not low-sodium or reduced-sodium). Regular canned tomato sauce and paste (not low-sodium or reduced-sodium). Regular tomato and vegetable juice (not low-sodium or reduced-sodium). Pickles. Olives. Fruits Canned fruit in a light or heavy syrup. Fried fruit. Fruit in cream or butter sauce. Meat and other protein foods Fatty cuts of meat. Ribs. Fried meat. Bacon. Sausage. Bologna and other processed lunch meats. Salami. Fatback. Hotdogs. Bratwurst. Salted nuts and seeds. Canned beans with added salt. Canned or smoked fish. Whole eggs or egg yolks. Chicken or turkey with skin. Dairy Whole or 2% milk, cream, and half-and-half. Whole or full-fat cream cheese. Whole-fat or sweetened yogurt. Full-fat cheese. Nondairy creamers. Whipped toppings.  Processed cheese and cheese spreads. Fats and oils Butter. Stick margarine. Lard. Shortening. Ghee. Bacon fat. Tropical oils, such as coconut, palm kernel, or palm oil. Seasoning and other foods Salted popcorn and pretzels. Onion salt, garlic salt, seasoned salt, table salt, and sea salt. Worcestershire sauce. Tartar sauce. Barbecue sauce. Teriyaki sauce. Soy sauce, including reduced-sodium. Steak sauce. Canned and packaged gravies. Fish sauce. Oyster sauce. Cocktail sauce. Horseradish that you find on the shelf. Ketchup. Mustard. Meat flavorings and tenderizers. Bouillon cubes. Hot sauce and Tabasco sauce. Premade or packaged marinades. Premade or packaged taco seasonings. Relishes. Regular salad dressings. Where to find more information:  National Heart, Lung, and Blood Institute: www.nhlbi.nih.gov  American Heart Association: www.heart.org Summary  The DASH eating plan is a healthy eating plan that has been shown to reduce high blood pressure (hypertension). It may also reduce your risk for type 2 diabetes, heart disease, and stroke.  With the DASH eating plan, you should limit salt (sodium) intake to 2,300 mg a day. If you have hypertension, you may need to reduce your sodium intake to 1,500 mg a day.  When on the DASH eating plan, aim to eat more fresh fruits and vegetables, whole grains, lean proteins, low-fat dairy, and heart-healthy fats.  Work with your health care provider or diet and nutrition specialist (dietitian) to adjust your eating plan to your individual   calorie needs. This information is not intended to replace advice given to you by your health care provider. Make sure you discuss any questions you have with your health care provider. Document Released: 09/29/2011 Document Revised: 10/03/2016 Document Reviewed: 10/03/2016 Elsevier Interactive Patient Education  2017 Elsevier Inc.  

## 2017-04-04 NOTE — Assessment & Plan Note (Signed)
Seems clinically euthyroid

## 2017-04-04 NOTE — Assessment & Plan Note (Signed)
BP Readings from Last 3 Encounters:  04/04/17 130/86  01/20/16 118/88  10/02/15 (!) 158/90   Good control Due for labs

## 2017-04-04 NOTE — Assessment & Plan Note (Signed)
Healthy but needs to work on fitness DASH info given Colon cancer screening starting at 75

## 2017-05-17 ENCOUNTER — Encounter: Payer: Self-pay | Admitting: Internal Medicine

## 2017-06-06 ENCOUNTER — Other Ambulatory Visit: Payer: Self-pay | Admitting: Internal Medicine

## 2017-06-07 ENCOUNTER — Other Ambulatory Visit: Payer: Self-pay | Admitting: Internal Medicine

## 2017-11-20 ENCOUNTER — Encounter: Payer: Self-pay | Admitting: Internal Medicine

## 2017-11-24 ENCOUNTER — Ambulatory Visit: Payer: 59 | Admitting: Internal Medicine

## 2017-11-24 ENCOUNTER — Encounter: Payer: Self-pay | Admitting: Internal Medicine

## 2017-11-24 VITALS — BP 116/86 | HR 78 | Temp 98.2°F | Wt 241.0 lb

## 2017-11-24 DIAGNOSIS — H6190 Disorder of external ear, unspecified, unspecified ear: Secondary | ICD-10-CM | POA: Insufficient documentation

## 2017-11-24 DIAGNOSIS — H6192 Disorder of left external ear, unspecified: Secondary | ICD-10-CM

## 2017-11-24 MED ORDER — AMOXICILLIN-POT CLAVULANATE 875-125 MG PO TABS: 1.0000 | ORAL_TABLET | Freq: Two times a day (BID) | ORAL | 0 refills | Status: AC

## 2017-11-24 NOTE — Patient Instructions (Signed)
Try the antibiotic. If this area doesn't clear up, I will refer you to a plastic surgeon.

## 2017-11-24 NOTE — Progress Notes (Signed)
   Subjective:    Patient ID: Victor Ford, male    DOB: 1971-03-24, 47 y.o.   MRN: [id number]  HPI Here to evaluate ongoing preauricular lesion--left Wife is here  Has a pit on the inside of his left pinna Started with preauricular spot which looked like a cyst Ulcer there--on other side of the pinna from the pit Has drained a couple of times  Concerned about cancer due to [place] of cancer (but mostly lymphoma)  Tried H2O2 on it Still some bleeding but did seem to clear out some of the discharge  Current Outpatient Medications on File Prior to Visit  Medication Sig Dispense Refill  . levothyroxine (SYNTHROID, LEVOTHROID) 175 MCG tablet TAKE ONE TABLET BY MOUTH ONE TIME DAILY BEFORE BREAKFAST 90 tablet 3  . losartan-hydrochlorothiazide (HYZAAR) 50-12.5 MG tablet TAKE 1 TABLET BY MOUTH DAILY. 90 tablet 3   No current facility-administered medications on file prior to visit.     No Known Allergies  Past Medical History:  Diagnosis Date  . Allergy   . Hyperlipidemia   . Hypertension   . Thyroid disease     Past Surgical History:  Procedure Laterality Date  . FINGER FRACTURE SURGERY     1980's Right 2nd/3rd finger repair    Family History  Problem Relation Age of Onset  . Alcohol abuse Father   . Hypertension Father   . Dementia Paternal Uncle   . Diabetes Neg Hx   . Cancer Neg Hx   . Heart disease Neg Hx     Social History   Socioeconomic History  . Marital status: Married    Spouse name: Not on file  . Number of children: 1  . Years of education: Not on file  . Highest education level: Not on file  Social Needs  . Financial resource strain: Not on file  . Food insecurity - worry: Not on file  . Food insecurity - inability: Not on file  . Transportation needs - medical: Not on file  . Transportation needs - non-medical: Not on file  Occupational History  . Occupation: Service writer--[employer]    Comment:    Tobacco Use  . Smoking status: Former  Research scientist (life sciences)  . Smokeless tobacco: Never Used  Substance and Sexual Activity  . Alcohol use: Yes    Comment: occasional  . Drug use: No  . Sexual activity: Not on file  Other Topics Concern  . Not on file  Social History Narrative  . Not on file   Review of Systems No fever Not sick    Objective:   Physical Exam  HENT:  Pit on upper outer left pinna (on inside) Below this is crusted area with slight redness Not tender          Assessment & Plan:

## 2017-11-24 NOTE — Assessment & Plan Note (Signed)
Above left pinna Likely has sinus tract from long standing ear pit With drainage, etc---likely some infection Will treat with augmentin If persists, would refer to plastic surgeon

## 2017-12-15 ENCOUNTER — Encounter: Payer: Self-pay | Admitting: Internal Medicine

## 2018-03-24 ENCOUNTER — Other Ambulatory Visit: Payer: Self-pay | Admitting: Internal Medicine

## 2018-04-09 ENCOUNTER — Encounter

## 2018-04-11 ENCOUNTER — Encounter: Payer: Self-pay | Admitting: Internal Medicine

## 2018-06-05 ENCOUNTER — Other Ambulatory Visit: Payer: Self-pay | Admitting: Internal Medicine

## 2018-07-03 ENCOUNTER — Encounter: Payer: Self-pay | Admitting: Internal Medicine

## 2018-07-03 ENCOUNTER — Ambulatory Visit (INDEPENDENT_AMBULATORY_CARE_PROVIDER_SITE_OTHER): Payer: BLUE CROSS/BLUE SHIELD | Admitting: Internal Medicine

## 2018-07-03 VITALS — BP 132/90 | HR 73 | Temp 98.0°F | Ht 68.5 in | Wt 253.0 lb

## 2018-07-03 DIAGNOSIS — Z23 Encounter for immunization: Secondary | ICD-10-CM | POA: Diagnosis not present

## 2018-07-03 DIAGNOSIS — Z0000 Encounter for general adult medical examination without abnormal findings: Secondary | ICD-10-CM | POA: Diagnosis not present

## 2018-07-03 DIAGNOSIS — I10 Essential (primary) hypertension: Secondary | ICD-10-CM | POA: Diagnosis not present

## 2018-07-03 DIAGNOSIS — E039 Hypothyroidism, unspecified: Secondary | ICD-10-CM | POA: Diagnosis not present

## 2018-07-03 LAB — COMPREHENSIVE METABOLIC PANEL
ALBUMIN: 4.2 g/dL (ref 3.5–5.2)
ALT: 33 U/L (ref 0–53)
AST: 21 U/L (ref 0–37)
Alkaline Phosphatase: 51 U/L (ref 39–117)
BILIRUBIN TOTAL: 0.6 mg/dL (ref 0.2–1.2)
BUN: 20 mg/dL (ref 6–23)
CHLORIDE: 104 meq/L (ref 96–112)
CO2: 28 meq/L (ref 19–32)
CREATININE: 0.9 mg/dL (ref 0.40–1.50)
Calcium: 8.8 mg/dL (ref 8.4–10.5)
GFR: 95.16 mL/min (ref >60.00)
Glucose, Bld: 100 mg/dL — ABNORMAL HIGH (ref 70–99)
POTASSIUM: 4 meq/L (ref 3.5–5.1)
Sodium: 138 mEq/L (ref 135–145)
Total Protein: 6.7 g/dL (ref 6.0–8.3)

## 2018-07-03 LAB — CBC
HCT: 41.3 % (ref 39.0–52.0)
Hemoglobin: 14 g/dL (ref 13.0–17.0)
MCHC: 34 g/dL (ref 30.0–36.0)
MCV: 87.3 fl (ref 78.0–100.0)
Platelets: 215 10*3/uL (ref 150.0–400.0)
RBC: 4.73 Mil/uL (ref 4.22–5.81)
RDW: 13.5 % (ref 11.5–15.5)
WBC: 4.5 10*3/uL (ref 4.0–10.5)

## 2018-07-03 LAB — T4, FREE: Free T4: 1.08 ng/dL (ref 0.60–1.60)

## 2018-07-03 LAB — TSH: TSH: 2.02 u[IU]/mL (ref 0.35–4.50)

## 2018-07-03 MED ORDER — LEVOTHYROXINE SODIUM 175 MCG PO TABS: 175.0000 ug | ORAL_TABLET | Freq: Every day | ORAL | 3 refills | Status: DC

## 2018-07-03 MED ORDER — LOSARTAN POTASSIUM-HCTZ 50-12.5 MG PO TABS: 1.0000 | ORAL_TABLET | Freq: Every day | ORAL | 3 refills | Status: DC

## 2018-07-03 NOTE — Progress Notes (Signed)
Subjective:    Patient ID: Victor Ford, male    DOB: [birth year], 47 y.o.   MRN: [id number]  HPI Here for physical Changed jobs again--working at [employer] in [place] well Has gained [employer] Wife doing keto diet---didn't work for him Plans to go back on [employer]-- treadmill   Rarely takes BP in store Usually okay  Current Outpatient Medications on File Prior to Visit  Medication Sig Dispense Refill  . levothyroxine (SYNTHROID, LEVOTHROID) 175 MCG tablet Take 1 tablet (175 mcg total) by mouth daily before breakfast. MUST SCHEDULE OFFICE VISIT 90 tablet 0  . losartan-hydrochlorothiazide (HYZAAR) 50-12.5 MG tablet TAKE 1 TABLET BY MOUTH DAILY. 90 tablet 1   No current facility-administered medications on file prior to visit.     No Known Allergies  Past Medical History:  Diagnosis Date  . Allergy   . Hyperlipidemia   . Hypertension   . Thyroid disease     Past Surgical History:  Procedure Laterality Date  . FINGER FRACTURE SURGERY     1980's Right 2nd/3rd finger repair    Family History  Problem Relation Age of Onset  . Alcohol abuse Father   . Hypertension Father   . Dementia Paternal Uncle   . Diabetes Neg Hx   . Cancer Neg Hx   . Heart disease Neg Hx     Social History   Socioeconomic History  . Marital status: Married    Spouse name: Not on file  . Number of children: 1  . Years of education: Not on file  . Highest education level: Not on file  Occupational History  . Occupation: Nature conservation officer    Comment: [employer] [employer]  Social Needs  . Financial resource strain: Not on file  . Food insecurity:    Worry: Not on file    Inability: Not on file  . Transportation needs:    Medical: Not on file    Non-medical: Not on file  Tobacco Use  . Smoking status: Former Research scientist (life sciences)  . Smokeless tobacco: Never Used  Substance and Sexual Activity  . Alcohol use: Yes    Comment: occasional  . Drug use: No  . Sexual activity: Not  on file  Lifestyle  . Physical activity:    Days per week: Not on file    Minutes per session: Not on file  . Stress: Not on file  Relationships  . Social connections:    Talks on phone: Not on file    Gets together: Not on file    Attends religious service: Not on file    Active member of club or organization: Not on file    Attends meetings of clubs or organizations: Not on file    Relationship status: Not on file  . Intimate partner violence:    Fear of current or ex partner: Not on file    Emotionally abused: Not on file    Physically abused: Not on file    Forced sexual activity: Not on file  Other Topics Concern  . Not on file  Social History Narrative  . Not on file   Review of Systems  Constitutional: Positive for unexpected weight change. Negative for fatigue.       Wears seat belt  HENT: Negative for dental problem, hearing loss, tinnitus and trouble swallowing.        Due for dentist  Respiratory: Negative for cough, chest tightness and shortness of breath.   Cardiovascular: Negative for chest  pain and leg swelling.  Gastrointestinal: Negative for blood in stool and constipation.       No heartburn  Endocrine: Negative for polydipsia and polyuria.  Genitourinary: Negative for difficulty urinating and urgency.       No sexual problems  Musculoskeletal: Negative for arthralgias, back pain and joint swelling.  Skin: Negative for rash.  Allergic/Immunologic: Positive for environmental allergies. Negative for immunocompromised state.       Uses OTC loratadine  Neurological: Negative for dizziness, syncope, light-headedness and headaches.  Hematological: Negative for adenopathy. Does not bruise/bleed easily.  Psychiatric/Behavioral: Negative for dysphoric mood and sleep disturbance. The patient is not nervous/anxious.        Objective:   Physical Exam  Constitutional: He is oriented to person, place, and time. He appears well-developed. No distress.  HENT:   Head: Normocephalic and atraumatic.  Right Ear: External ear normal.  Left Ear: External ear normal.  Mouth/Throat: Oropharynx is clear and moist.  Eyes: Pupils are equal, round, and reactive to light. Conjunctivae are normal.  Neck: No thyromegaly present.  Cardiovascular: Normal rate, regular rhythm, normal heart sounds and intact distal pulses. Exam reveals no gallop.  No murmur heard. Respiratory: Effort normal and breath sounds normal. No respiratory distress. He has no wheezes. He has no rales.  GI: Soft. There is no tenderness.  Musculoskeletal: He exhibits no edema or tenderness.  Lymphadenopathy:    He has no cervical adenopathy.  Neurological: He is alert and oriented to person, place, and time.  Skin: No rash noted. No erythema.  Psychiatric: He has a normal mood and affect. His behavior is normal.           Assessment & Plan:

## 2018-07-03 NOTE — Assessment & Plan Note (Signed)
BP Readings from Last 3 Encounters:  07/03/18 132/90  11/24/17 116/86  04/04/17 130/86   Good control Should improve with weight loss

## 2018-07-03 NOTE — Assessment & Plan Note (Signed)
Seems to be euthryoid

## 2018-07-03 NOTE — Assessment & Plan Note (Signed)
Healthy but has worsened with fitness Will resume [employer] Flu vaccine today

## 2018-07-03 NOTE — Addendum Note (Signed)
Addended by: Pilar Grammes on: 07/03/2018 08:43 AM   Modules accepted: Orders

## 2018-08-02 DIAGNOSIS — H18603 Keratoconus, unspecified, bilateral: Secondary | ICD-10-CM | POA: Diagnosis not present

## 2018-10-24 HISTORY — PX: COLONOSCOPY: SHX174

## 2019-07-10 ENCOUNTER — Other Ambulatory Visit: Payer: Self-pay

## 2019-07-10 ENCOUNTER — Ambulatory Visit (INDEPENDENT_AMBULATORY_CARE_PROVIDER_SITE_OTHER): Payer: BC Managed Care – PPO | Admitting: Internal Medicine

## 2019-07-10 ENCOUNTER — Encounter: Payer: Self-pay | Admitting: Internal Medicine

## 2019-07-10 VITALS — BP 128/86 | HR 86 | Temp 98.1°F | Ht 68.25 in | Wt 258.0 lb

## 2019-07-10 DIAGNOSIS — I10 Essential (primary) hypertension: Secondary | ICD-10-CM | POA: Diagnosis not present

## 2019-07-10 DIAGNOSIS — E039 Hypothyroidism, unspecified: Secondary | ICD-10-CM | POA: Diagnosis not present

## 2019-07-10 DIAGNOSIS — Z0000 Encounter for general adult medical examination without abnormal findings: Secondary | ICD-10-CM

## 2019-07-10 DIAGNOSIS — Z23 Encounter for immunization: Secondary | ICD-10-CM

## 2019-07-10 DIAGNOSIS — Z1211 Encounter for screening for malignant neoplasm of colon: Secondary | ICD-10-CM

## 2019-07-10 LAB — TSH: TSH: 4.1 u[IU]/mL (ref 0.35–4.50)

## 2019-07-10 LAB — CBC
HCT: 43.6 % (ref 39.0–52.0)
Hemoglobin: 14.9 g/dL (ref 13.0–17.0)
MCHC: 34.1 g/dL (ref 30.0–36.0)
MCV: 88.6 fl (ref 78.0–100.0)
Platelets: 203 10*3/uL (ref 150.0–400.0)
RBC: 4.92 Mil/uL (ref 4.22–5.81)
RDW: 13.9 % (ref 11.5–15.5)
WBC: 6 10*3/uL (ref 4.0–10.5)

## 2019-07-10 LAB — COMPREHENSIVE METABOLIC PANEL
ALT: 36 U/L (ref 0–53)
AST: 26 U/L (ref 0–37)
Albumin: 4.5 g/dL (ref 3.5–5.2)
Alkaline Phosphatase: 68 U/L (ref 39–117)
BUN: 21 mg/dL (ref 6–23)
CO2: 30 mEq/L (ref 19–32)
Calcium: 9.5 mg/dL (ref 8.4–10.5)
Chloride: 101 mEq/L (ref 96–112)
Creatinine, Ser: 0.87 mg/dL (ref 0.40–1.50)
GFR: 92.72 mL/min (ref >60.00)
Glucose, Bld: 92 mg/dL (ref 70–99)
Potassium: 4.3 mEq/L (ref 3.5–5.1)
Sodium: 138 mEq/L (ref 135–145)
Total Bilirubin: 0.7 mg/dL (ref 0.2–1.2)
Total Protein: 7 g/dL (ref 6.0–8.3)

## 2019-07-10 LAB — T4, FREE: Free T4: 1.28 ng/dL (ref 0.60–1.60)

## 2019-07-10 MED ORDER — LOSARTAN POTASSIUM-HCTZ 50-12.5 MG PO TABS: 1.0000 | ORAL_TABLET | Freq: Every day | ORAL | 3 refills | Status: DC

## 2019-07-10 MED ORDER — LEVOTHYROXINE SODIUM 175 MCG PO TABS: 175.0000 ug | ORAL_TABLET | Freq: Every day | ORAL | 3 refills | Status: DC

## 2019-07-10 NOTE — Assessment & Plan Note (Signed)
Seems euthryoid Will check labs 

## 2019-07-10 NOTE — Progress Notes (Signed)
Subjective:    Patient ID: Victor Ford, male    DOB: [birth year], 47 y.o.   MRN: [id number]  HPI Here for physical Job is fine--still working at [employer] in [place]  No health concerns Now doing the keto diet--cut out all carbs still (for 3-4 months) He hasn't lost weight though Walks at work---too tired to exercise after  Hasn't been checking blood work  Current Outpatient Medications on File Prior to Visit  Medication Sig Dispense Refill  . levothyroxine (SYNTHROID, LEVOTHROID) 175 MCG tablet Take 1 tablet (175 mcg total) by mouth daily before breakfast. 90 tablet 3  . losartan-hydrochlorothiazide (HYZAAR) 50-12.5 MG tablet Take 1 tablet by mouth daily. 90 tablet 3   No current facility-administered medications on file prior to visit.     No Known Allergies  Past Medical History:  Diagnosis Date  . Allergy   . Hyperlipidemia   . Hypertension   . Thyroid disease     Past Surgical History:  Procedure Laterality Date  . FINGER FRACTURE SURGERY     1980's Right 2nd/3rd finger repair    Family History  Problem Relation Age of Onset  . Alcohol abuse Father   . Hypertension Father   . Dementia Paternal Uncle   . Diabetes Neg Hx   . Cancer Neg Hx   . Heart disease Neg Hx     Social History   Socioeconomic History  . Marital status: Married    Spouse name: Not on file  . Number of children: 1  . Years of education: Not on file  . Highest education level: Not on file  Occupational History  . Occupation: Nature conservation officer    Comment: [employer]--[place]  Social Needs  . Financial resource strain: Not on file  . Food insecurity    Worry: Not on file    Inability: Not on file  . Transportation needs    Medical: Not on file    Non-medical: Not on file  Tobacco Use  . Smoking status: Former Research scientist (life sciences)  . Smokeless tobacco: Never Used  Substance and Sexual Activity  . Alcohol use: Yes    Comment: occasional  . Drug use: No  . Sexual activity: Not on file  Lifestyle   . Physical activity    Days per week: Not on file    Minutes per session: Not on file  . Stress: Not on file  Relationships  . Social Herbalist on phone: Not on file    Gets together: Not on file    Attends religious service: Not on file    Active member of club or organization: Not on file    Attends meetings of clubs or organizations: Not on file    Relationship status: Not on file  . Intimate partner violence    Fear of current or ex partner: Not on file    Emotionally abused: Not on file    Physically abused: Not on file    Forced sexual activity: Not on file  Other Topics Concern  . Not on file  Social History Narrative  . Not on file   Review of Systems  Constitutional: Positive for unexpected weight change. Negative for fatigue.       Wears seat belt  HENT: Negative for dental problem, hearing loss and tinnitus.        Usually keeps up with dentist  Eyes: Negative for visual disturbance.       No diplopia or unilateral vision loss  Respiratory: Negative for cough, chest tightness and shortness of breath.   Cardiovascular: Negative for chest pain, palpitations and leg swelling.  Gastrointestinal: Negative for abdominal pain, blood in stool and constipation.       No heartburn  Endocrine: Negative for polydipsia and polyuria.  Genitourinary: Negative for difficulty urinating and urgency.       No sexual problems  Musculoskeletal: Negative for arthralgias, back pain and joint swelling.  Skin: Negative for rash.       No suspicious lesions  Allergic/Immunologic: Positive for environmental allergies. Negative for immunocompromised state.       Zyrtec prn works  Neurological: Negative for dizziness, syncope, light-headedness and headaches.  Hematological: Negative for adenopathy. Does not bruise/bleed easily.  Psychiatric/Behavioral: Negative for dysphoric mood and sleep disturbance. The patient is not nervous/anxious.        Objective:   Physical Exam   Constitutional: He is oriented to person, place, and time. He appears well-developed. No distress.  HENT:  Head: Normocephalic and atraumatic.  Right Ear: External ear normal.  Left Ear: External ear normal.  Mouth/Throat: Oropharynx is clear and moist. No oropharyngeal exudate.  Eyes: Pupils are equal, round, and reactive to light. Conjunctivae are normal.  Neck: No thyromegaly present.  Cardiovascular: Normal rate, regular rhythm, normal heart sounds and intact distal pulses. Exam reveals no gallop.  No murmur heard. Respiratory: Effort normal and breath sounds normal. No respiratory distress. He has no wheezes. He has no rales.  GI: Soft. There is no abdominal tenderness.  Musculoskeletal:        General: No tenderness or edema.  Lymphadenopathy:    He has no cervical adenopathy.  Neurological: He is alert and oriented to person, place, and time.  Skin: No rash noted. No erythema.  Psychiatric: He has a normal mood and affect. His behavior is normal.           Assessment & Plan:

## 2019-07-10 NOTE — Assessment & Plan Note (Signed)
Healthy but weight going up Discussed calorie restriction in addition to watching carbs Needs to increase metabolic rate Will set up colon Defer prostate cancer screening Flu vaccine today

## 2019-07-10 NOTE — Assessment & Plan Note (Signed)
BP Readings from Last 3 Encounters:  07/10/19 128/86  07/03/18 132/90  11/24/17 116/86   Good control Will check labs

## 2019-09-12 DIAGNOSIS — Z20828 Contact with and (suspected) exposure to other viral communicable diseases: Secondary | ICD-10-CM | POA: Diagnosis not present

## 2019-09-16 ENCOUNTER — Ambulatory Visit (AMBULATORY_SURGERY_CENTER): Payer: BC Managed Care – PPO | Admitting: *Deleted

## 2019-09-16 ENCOUNTER — Other Ambulatory Visit: Payer: Self-pay

## 2019-09-16 ENCOUNTER — Encounter: Payer: Self-pay | Admitting: Gastroenterology

## 2019-09-16 VITALS — Ht 68.0 in | Wt 268.4 lb

## 2019-09-16 DIAGNOSIS — Z1159 Encounter for screening for other viral diseases: Secondary | ICD-10-CM

## 2019-09-16 DIAGNOSIS — Z1211 Encounter for screening for malignant neoplasm of colon: Secondary | ICD-10-CM

## 2019-09-16 MED ORDER — SUPREP BOWEL PREP KIT 17.5-3.13-1.6 GM/177ML PO SOLN: 1.0000 | Freq: Once | ORAL | 0 refills | Status: AC

## 2019-09-16 NOTE — Progress Notes (Signed)
Patient denies any allergies to egg or soy products. Patient denies complications with anesthesia/sedation.  Patient denies oxygen use at home and denies diet medications. Emmi instructions for colonoscopy/endoscopy explained and given to patient.  Suprep coupon given.   

## 2019-09-25 ENCOUNTER — Other Ambulatory Visit: Payer: Self-pay | Admitting: Gastroenterology

## 2019-09-25 ENCOUNTER — Ambulatory Visit (INDEPENDENT_AMBULATORY_CARE_PROVIDER_SITE_OTHER): Payer: BC Managed Care – PPO

## 2019-09-25 DIAGNOSIS — Z1159 Encounter for screening for other viral diseases: Secondary | ICD-10-CM | POA: Diagnosis not present

## 2019-09-26 LAB — SARS CORONAVIRUS 2 (TAT 6-24 HRS): SARS Coronavirus 2: NEGATIVE

## 2019-09-30 ENCOUNTER — Encounter: Payer: Self-pay | Admitting: Gastroenterology

## 2019-09-30 ENCOUNTER — Other Ambulatory Visit: Payer: Self-pay

## 2019-09-30 ENCOUNTER — Ambulatory Visit (AMBULATORY_SURGERY_CENTER): Payer: BC Managed Care – PPO | Admitting: Gastroenterology

## 2019-09-30 VITALS — BP 124/81 | HR 76 | Temp 98.8°F | Resp 12 | Ht 68.0 in | Wt 268.0 lb

## 2019-09-30 DIAGNOSIS — D125 Benign neoplasm of sigmoid colon: Secondary | ICD-10-CM

## 2019-09-30 DIAGNOSIS — D127 Benign neoplasm of rectosigmoid junction: Secondary | ICD-10-CM | POA: Diagnosis not present

## 2019-09-30 DIAGNOSIS — K621 Rectal polyp: Secondary | ICD-10-CM | POA: Diagnosis not present

## 2019-09-30 DIAGNOSIS — Z1211 Encounter for screening for malignant neoplasm of colon: Secondary | ICD-10-CM

## 2019-09-30 DIAGNOSIS — D128 Benign neoplasm of rectum: Secondary | ICD-10-CM

## 2019-09-30 DIAGNOSIS — K635 Polyp of colon: Secondary | ICD-10-CM

## 2019-09-30 DIAGNOSIS — D122 Benign neoplasm of ascending colon: Secondary | ICD-10-CM

## 2019-09-30 DIAGNOSIS — D124 Benign neoplasm of descending colon: Secondary | ICD-10-CM

## 2019-09-30 MED ORDER — SODIUM CHLORIDE 0.9 % IV SOLN: 500.0000 mL | Freq: Once | INTRAVENOUS | Status: DC

## 2019-09-30 NOTE — Progress Notes (Signed)
Report given to PACU, vss 

## 2019-09-30 NOTE — Progress Notes (Signed)
Pt's states no medical or surgical changes since previsit or office visit.  JB - temp CW - vitals. 

## 2019-09-30 NOTE — Progress Notes (Signed)
Called to room to assist during endoscopic procedure.  Patient ID and intended procedure confirmed with present staff. Received instructions for my participation in the procedure from the performing physician.  

## 2019-09-30 NOTE — Op Note (Signed)
[place] Patient Name: Victor Ford Procedure Date: 09/30/2019 1:05 PM MRN: [id number] Endoscopist: Thornton Park MD, MD Age: 48 Referring MD:  Date of Birth: [birth year] Gender: Male Account #: [id number] Procedure:                Colonoscopy Indications:              Screening for colorectal malignant neoplasm, This                            is the patient's first colonoscopy                           No known family history of colon cancer or polyps Medicines:                Monitored Anesthesia Care Procedure:                Pre-Anesthesia Assessment:                           - Prior to the procedure, a History and Physical                            was performed, and patient medications and                            allergies were reviewed. The patient's tolerance of                            previous anesthesia was also reviewed. The risks                            and benefits of the procedure and the sedation                            options and risks were discussed with the patient.                            All questions were answered, and informed consent                            was obtained. Prior Anticoagulants: The patient has                            taken no previous anticoagulant or antiplatelet                            agents. ASA Grade Assessment: III - A patient with                            severe systemic disease. After reviewing the risks                            and benefits, the patient was deemed in  satisfactory condition to undergo the procedure.                           After obtaining informed consent, the colonoscope                            was passed under direct vision. Throughout the                            procedure, the patient's blood pressure, pulse, and                            oxygen saturations were monitored continuously. The                            Colonoscope was  introduced through the anus and                            advanced to the the cecum, identified by                            appendiceal orifice and ileocecal valve. A second                            forward view of the right colon was performed. The                            colonoscopy was performed without difficulty. The                            patient tolerated the procedure well. The quality                            of the bowel preparation was adequate to identify                            polyps 6 mm and larger in size. The ileocecal                            valve, appendiceal orifice, and rectum were                            photographed. Scope In: 1:25:34 PM Scope Out: 1:43:49 PM Scope Withdrawal Time: 0 hours 15 minutes 13 seconds  Total Procedure Duration: 0 hours 18 minutes 15 seconds  Findings:                 The perianal and digital rectal examinations were                            normal.                           A few small and large-mouthed diverticula were  found in the sigmoid colon.                           A 3 mm polyp was found in the ascending colon. The                            polyp was sessile. The polyp was removed with a                            cold snare. Resection and retrieval were complete.                            Estimated blood loss was minimal.                           A 2 mm polyp was found in the descending colon. The                            polyp was sessile. The polyp was removed with a                            cold snare. Resection and retrieval were complete.                            Estimated blood loss was minimal.                           A 2 mm polyp was found in the sigmoid colon. The                            polyp was sessile. The polyp was removed with a                            cold snare. Resection and retrieval were complete.                            Estimated blood  loss was minimal.                           A 2 mm polyp was found in the rectum. The polyp was                            sessile. The polyp was removed with a cold snare.                            Resection and retrieval were complete. Estimated                            blood loss was minimal.                           There were some large food fibers present in the  right colon limiting a complete evaluation for                            small polyps or lesions in the cecum and proximal                            and mid ascending colon. I tried to move these                            fibers out of the way, but, they were too large.                            They were also too large for successful lavage. The                            exam was otherwise without abnormality on direct                            and retroflexion views. Complications:            No immediate complications. Estimated blood loss:                            Minimal. Estimated Blood Loss:     Estimated blood loss was minimal. Impression:               - Diverticulosis in the sigmoid colon.                           - One 3 mm polyp in the ascending colon, removed                            with a cold snare. Resected and retrieved.                           - One 2 mm polyp in the descending colon, removed                            with a cold snare. Resected and retrieved.                           - One 2 mm polyp in the sigmoid colon, removed with                            a cold snare. Resected and retrieved.                           - One 2 mm polyp in the rectum, removed with a cold                            snare. Resected and retrieved.                           -  The examination was otherwise normal on direct                            and retroflexion views. Recommendation:           - Patient has a contact number available for                             emergencies. The signs and symptoms of potential                            delayed complications were discussed with the                            patient. Return to normal activities tomorrow.                            Written discharge instructions were provided to the                            patient.                           - High fiber diet.                           - Continue present medications.                           - Await pathology results.                           - Repeat colonoscopy date to be determined after                            pending pathology results are reviewed because the                            bowel preparation was suboptimal. A twi day prep                            will be recommended in the future. Thornton Park MD, MD 09/30/2019 1:54:49 PM This report has been signed electronically.

## 2019-09-30 NOTE — Patient Instructions (Signed)
HANDOUTS PROVIDED ON: HIGH FIBER DIET, POLYPS, & DIVERTICULOSIS   THE POLYPS TAKEN TODAY HAVE BEEN SENT FOR PATHOLOGY.  THE RESULTS CAN TAKE 2-3 WEEKS TO RECEIVE.  BASED ON THE RESULTS IS WHEN YOUR NEXT COLONOSCOPY WILL BE RECOMMENDED.  YOU MAY RESUME YOUR PREVIOUS MEDICATIONS AND INCORPORATE MORE FIBER IN YOUR DAILY DIET.  [place] YOU FOR ALLOWING [place] TO CARE FOR YOU TODAY!!!  YOU HAD AN ENDOSCOPIC PROCEDURE TODAY AT [facility]:   Refer to the procedure report that was given to you for any specific questions about what was found during the examination.  If the procedure report does not answer your questions, please call your gastroenterologist to clarify.  If you requested that your care partner not be given the details of your procedure findings, then the procedure report has been included in a sealed envelope for you to review at your convenience later.  YOU SHOULD EXPECT: Some feelings of bloating in the abdomen. Passage of more gas than usual.  Walking can help get rid of the air that was put into your GI tract during the procedure and reduce the bloating. If you had a lower endoscopy (such as a colonoscopy or flexible sigmoidoscopy) you may notice spotting of blood in your stool or on the toilet paper. If you underwent a bowel prep for your procedure, you may not have a normal bowel movement for a few days.  Please Note:  You might notice some irritation and congestion in your nose or some drainage.  This is from the oxygen used during your procedure.  There is no need for concern and it should clear up in a day or so.  SYMPTOMS TO REPORT IMMEDIATELY:   Following lower endoscopy (colonoscopy or flexible sigmoidoscopy):  Excessive amounts of blood in the stool  Significant tenderness or worsening of abdominal pains  Swelling of the abdomen that is new, acute  Fever of 100F or higher  For urgent or emergent issues, a gastroenterologist can be reached at any hour by calling  [phone].   DIET:  We do recommend a small meal at first, but then you may proceed to your regular diet.  Drink plenty of fluids but you should avoid alcoholic beverages for 24 hours.  ACTIVITY:  You should plan to take it easy for the rest of today and you should NOT DRIVE or use heavy machinery until tomorrow (because of the sedation medicines used during the test).    FOLLOW UP: Our staff will call the number listed on your records 48-72 hours following your procedure to check on you and address any questions or concerns that you may have regarding the information given to you following your procedure. If we do not reach you, we will leave a message.  We will attempt to reach you two times.  During this call, we will ask if you have developed any symptoms of COVID 19. If you develop any symptoms (ie: fever, flu-like symptoms, shortness of breath, cough etc.) before then, please call [phone].  If you test positive for Covid 19 in the 2 weeks post procedure, please call and report this information to [place].    If any biopsies were taken you will be contacted by phone or by letter within the next 1-3 weeks.  Please call us at [phone] if you have not heard about the biopsies in 3 weeks.    SIGNATURES/CONFIDENTIALITY: You and/or your care partner have signed paperwork which will be entered into your electronic  medical record.  These signatures attest to the fact that that the information above on your After Visit Summary has been reviewed and is understood.  Full responsibility of the confidentiality of this discharge information lies with you and/or your care-partner.

## 2019-10-02 ENCOUNTER — Telehealth: Payer: Self-pay

## 2019-10-02 NOTE — Telephone Encounter (Signed)
  Follow up Call-  Call back number 09/30/2019  Post procedure Call Back phone  # [phone]  Permission to leave phone message Yes  Some recent data might be hidden     Patient questions:  Do you have a fever, pain , or abdominal swelling? No. Pain Score  0 *  Have you tolerated food without any problems? Yes.    Have you been able to return to your normal activities? Yes.    Do you have any questions about your discharge instructions: Diet   No. Medications  No. Follow up visit  No.  Do you have questions or concerns about your Care? No.  Actions: * If pain score is 4 or above: No action needed, pain <4.  1. Have you developed a fever since your procedure? no  2.   Have you had an respiratory symptoms (SOB or cough) since your procedure? no  3.   Have you tested positive for COVID 19 since your procedure? no  4.   Have you had any family members/close contacts diagnosed with the COVID 19 since your procedure?  no   If yes to any of these questions please route to Joylene John, RN and Alphonsa Gin, Therapist, sports.

## 2019-10-05 ENCOUNTER — Encounter: Payer: Self-pay | Admitting: Gastroenterology

## 2020-04-13 ENCOUNTER — Other Ambulatory Visit: Payer: Self-pay | Admitting: Internal Medicine

## 2020-05-21 NOTE — Telephone Encounter (Signed)
Please abstract

## 2020-07-14 ENCOUNTER — Encounter: Payer: BC Managed Care – PPO | Admitting: Internal Medicine

## 2020-07-15 ENCOUNTER — Other Ambulatory Visit: Payer: Self-pay | Admitting: Internal Medicine

## 2020-11-09 ENCOUNTER — Encounter: Payer: 59 | Admitting: Internal Medicine

## 2021-01-13 ENCOUNTER — Other Ambulatory Visit: Payer: Self-pay

## 2021-01-13 ENCOUNTER — Ambulatory Visit (INDEPENDENT_AMBULATORY_CARE_PROVIDER_SITE_OTHER): Payer: 59 | Admitting: Internal Medicine

## 2021-01-13 ENCOUNTER — Encounter: Payer: Self-pay | Admitting: Internal Medicine

## 2021-01-13 VITALS — BP 128/90 | HR 79 | Temp 97.5°F | Ht 68.25 in | Wt 272.0 lb

## 2021-01-13 DIAGNOSIS — Z23 Encounter for immunization: Secondary | ICD-10-CM

## 2021-01-13 DIAGNOSIS — Z0000 Encounter for general adult medical examination without abnormal findings: Secondary | ICD-10-CM | POA: Diagnosis not present

## 2021-01-13 DIAGNOSIS — I10 Essential (primary) hypertension: Secondary | ICD-10-CM

## 2021-01-13 DIAGNOSIS — E039 Hypothyroidism, unspecified: Secondary | ICD-10-CM

## 2021-01-13 LAB — CBC
HCT: 42.3 % (ref 39.0–52.0)
Hemoglobin: 14.4 g/dL (ref 13.0–17.0)
MCHC: 34.1 g/dL (ref 30.0–36.0)
MCV: 88.7 fl (ref 78.0–100.0)
Platelets: 241 10*3/uL (ref 150.0–400.0)
RBC: 4.78 Mil/uL (ref 4.22–5.81)
RDW: 13.8 % (ref 11.5–15.5)
WBC: 5.7 10*3/uL (ref 4.0–10.5)

## 2021-01-13 LAB — COMPREHENSIVE METABOLIC PANEL
ALT: 25 U/L (ref 0–53)
AST: 20 U/L (ref 0–37)
Albumin: 4.5 g/dL (ref 3.5–5.2)
Alkaline Phosphatase: 69 U/L (ref 39–117)
BUN: 19 mg/dL (ref 6–23)
CO2: 32 mEq/L (ref 19–32)
Calcium: 9.6 mg/dL (ref 8.4–10.5)
Chloride: 101 mEq/L (ref 96–112)
Creatinine, Ser: 0.8 mg/dL (ref 0.40–1.50)
GFR: 102.15 mL/min (ref >60.00)
Glucose, Bld: 103 mg/dL — ABNORMAL HIGH (ref 70–99)
Potassium: 4.3 mEq/L (ref 3.5–5.1)
Sodium: 140 mEq/L (ref 135–145)
Total Bilirubin: 0.5 mg/dL (ref 0.2–1.2)
Total Protein: 6.8 g/dL (ref 6.0–8.3)

## 2021-01-13 LAB — LIPID PANEL
Cholesterol: 200 mg/dL (ref 0–200)
HDL: 65.3 mg/dL (ref >39.00)
LDL Cholesterol: 108 mg/dL — ABNORMAL HIGH (ref 0–99)
NonHDL: 134.76
Total CHOL/HDL Ratio: 3
Triglycerides: 136 mg/dL (ref 0.0–149.0)
VLDL: 27.2 mg/dL (ref 0.0–40.0)

## 2021-01-13 LAB — TSH: TSH: 5.7 u[IU]/mL — ABNORMAL HIGH (ref 0.35–4.50)

## 2021-01-13 LAB — T4, FREE: Free T4: 1.34 ng/dL (ref 0.60–1.60)

## 2021-01-13 MED ORDER — LOSARTAN POTASSIUM-HCTZ 50-12.5 MG PO TABS: 1.0000 | ORAL_TABLET | Freq: Every day | ORAL | 3 refills | Status: DC

## 2021-01-13 MED ORDER — LEVOTHYROXINE SODIUM 175 MCG PO TABS: 175.0000 ug | ORAL_TABLET | Freq: Every day | ORAL | 3 refills | Status: DC

## 2021-01-13 MED ORDER — SEMAGLUTIDE 3 MG PO TABS: 3.0000 mg | ORAL_TABLET | Freq: Every day | ORAL | 0 refills | Status: DC

## 2021-01-13 MED ORDER — SEMAGLUTIDE 7 MG PO TABS: 7.0000 mg | ORAL_TABLET | Freq: Every day | ORAL | 11 refills | Status: DC

## 2021-01-13 NOTE — Addendum Note (Signed)
Addended by: Pilar Grammes on: 01/13/2021 09:05 AM   Modules accepted: Orders

## 2021-01-13 NOTE — Assessment & Plan Note (Signed)
Healthy but has gained more weight Discussed fitness, low carb eating Colon due again 2023 Defer PSA to age 50 Td today Flu vaccine in the fall

## 2021-01-13 NOTE — Assessment & Plan Note (Signed)
I discussed true [place] and increased exercise He is willing to try oral semaglutide---will see if it is covered

## 2021-01-13 NOTE — Assessment & Plan Note (Signed)
Seems euthyroid ?Will check labs ?

## 2021-01-13 NOTE — Assessment & Plan Note (Signed)
BP Readings from Last 3 Encounters:  01/13/21 128/90  09/30/19 124/81  07/10/19 128/86   Good control on losartan HCTZ Will check labs

## 2021-01-13 NOTE — Patient Instructions (Signed)
You should try the true [employer].

## 2021-01-13 NOTE — Progress Notes (Signed)
Subjective:    Patient ID: Victor Ford, male    DOB: 12/03/1970, 50 y.o.   MRN: [id number]  HPI Here for physical This visit occurred during the SARS-CoV-2 public health emergency.  Safety protocols were in place, including screening questions prior to the visit, additional usage of staff PPE, and extensive cleaning of exam room while observing appropriate contact time as indicated for disinfecting solutions.   Got bad joint pains after COVID vaccine--noted after the second Notices in his hips and left knee---"almost feels like a pinched nerve" Chronic issues with right knee Comes and goes---worse depending on the weather No problems in shoulders Ibuprofen helps-- 600mg  Not limiting but does have some radiation to foot  Has been walking some--has treadmill Has cut back on carbs  Current Outpatient Medications on File Prior to Visit  Medication Sig Dispense Refill  . levothyroxine (SYNTHROID) 175 MCG tablet TAKE 1 TABLET (175 MCG TOTAL) BY MOUTH DAILY BEFORE BREAKFAST. 90 tablet 1  . losartan-hydrochlorothiazide (HYZAAR) 50-12.5 MG tablet TAKE 1 TABLET BY MOUTH EVERY DAY 90 tablet 3   No current facility-administered medications on file prior to visit.    No Known Allergies  Past Medical History:  Diagnosis Date  . Allergy   . Hyperlipidemia    diet controlled/no meds  . Hypertension   . Thyroid disease     Past Surgical History:  Procedure Laterality Date  . FINGER FRACTURE SURGERY     1980's Right 2nd/3rd finger repair    Family History  Problem Relation Age of Onset  . Alcohol abuse Father   . Hypertension Father   . Dementia Paternal Uncle   . Diabetes Neg Hx   . Cancer Neg Hx   . Heart disease Neg Hx   . Colon cancer Neg Hx   . Rectal cancer Neg Hx   . Stomach cancer Neg Hx     Social History   Socioeconomic History  . Marital status: Married    Spouse name: Not on file  . Number of children: 1  . Years of education: Not on file  . Highest  education level: Not on file  Occupational History  . Occupation: Nature conservation officer    Comment: [employer]  Tobacco Use  . Smoking status: Former Smoker    Packs/day: 1.00    Years: 15.00    Pack years: 15.00    Types: Cigarettes  . Smokeless tobacco: Never Used  Vaping Use  . Vaping Use: Never used  Substance and Sexual Activity  . Alcohol use: Yes    Alcohol/week: 14.0 standard drinks    Types: 14 Cans of beer per week  . Drug use: No  . Sexual activity: Yes  Other Topics Concern  . Not on file  Social History Narrative  . Not on file   Social Determinants of Health   Financial Resource Strain: Not on file  Food Insecurity: Not on file  Transportation Needs: Not on file  Physical Activity: Not on file  Stress: Not on file  Social Connections: Not on file  Intimate Partner Violence: Not on file   Review of Systems  Constitutional: Negative for fatigue.       Weight up 4# Wears seat belt  HENT: Negative for dental problem, hearing loss and tinnitus.   Eyes: Negative for visual disturbance.       No diplopia or unilateral vision loss  Respiratory: Negative for cough, chest tightness and shortness of breath.   Cardiovascular: Negative for chest pain,  palpitations and leg swelling.  Gastrointestinal: Negative for blood in stool and constipation.       No heartburn  Endocrine: Negative for polydipsia and polyuria.  Genitourinary: Negative for difficulty urinating and urgency.       No sexual problems  Musculoskeletal: Positive for arthralgias. Negative for back pain and joint swelling.  Skin: Negative for rash.  Allergic/Immunologic: Positive for environmental allergies. Negative for immunocompromised state.       OTC meds are effective  Neurological: Negative for dizziness, syncope, light-headedness and headaches.  Hematological: Negative for adenopathy. Does not bruise/bleed easily.  Psychiatric/Behavioral: Negative for dysphoric mood and sleep disturbance. The  patient is not nervous/anxious.        Objective:   Physical Exam Constitutional:      Appearance: Normal appearance.  HENT:     Mouth/Throat:     Pharynx: No oropharyngeal exudate or posterior oropharyngeal erythema.  Eyes:     Conjunctiva/sclera: Conjunctivae normal.     Pupils: Pupils are equal, round, and reactive to light.  Cardiovascular:     Rate and Rhythm: Normal rate and regular rhythm.     Pulses: Normal pulses.     Heart sounds: No murmur heard. No gallop.   Pulmonary:     Effort: Pulmonary effort is normal.     Breath sounds: Normal breath sounds. No wheezing or rales.  Abdominal:     Palpations: Abdomen is soft.     Tenderness: There is no abdominal tenderness.  Musculoskeletal:     Cervical back: Neck supple.     Right lower leg: No edema.     Left lower leg: No edema.     Comments: Very little internal rotation of left hip  Lymphadenopathy:     Cervical: No cervical adenopathy.  Skin:    General: Skin is warm.     Findings: No rash.  Neurological:     General: No focal deficit present.     Mental Status: He is alert and oriented to person, place, and time.  Psychiatric:        Mood and Affect: Mood normal.        Behavior: Behavior normal.            Assessment & Plan:

## 2021-02-18 ENCOUNTER — Telehealth: Payer: Self-pay

## 2021-02-18 MED ORDER — SEMAGLUTIDE 7 MG PO TABS: 7.0000 mg | ORAL_TABLET | Freq: Every day | ORAL | 3 refills | Status: DC

## 2021-02-18 NOTE — Telephone Encounter (Signed)
Great! Thank you. I have sent the 7mg  to [facility].

## 2021-02-18 NOTE — Telephone Encounter (Signed)
Received a refill request from [facility] for Rybelsus. It did not have the mgs on it. Left message for pt to call office to let us know if he has gone up to the 7mg  and that is what we need to send in to mail order.

## 2021-02-18 NOTE — Telephone Encounter (Signed)
Pt called in and stated that he has gone up to the 7mg .

## 2021-02-22 MED ORDER — LEVOTHYROXINE SODIUM 175 MCG PO TABS: 175.0000 ug | ORAL_TABLET | Freq: Every day | ORAL | 3 refills | Status: DC

## 2021-02-22 MED ORDER — LOSARTAN POTASSIUM-HCTZ 50-12.5 MG PO TABS: 1.0000 | ORAL_TABLET | Freq: Every day | ORAL | 3 refills | Status: DC

## 2021-02-22 NOTE — Addendum Note (Signed)
Addended by: Pilar Grammes on: 02/22/2021 04:51 PM   Modules accepted: Orders

## 2021-02-22 NOTE — Telephone Encounter (Signed)
Received requests for losartan-hct and levothyroxine to mail order, also. I have sent them in.

## 2021-12-07 ENCOUNTER — Encounter: Payer: Self-pay | Admitting: Internal Medicine

## 2021-12-08 MED ORDER — SEMAGLUTIDE 7 MG PO TABS: 7.0000 mg | ORAL_TABLET | Freq: Every day | ORAL | 3 refills | Status: DC

## 2021-12-08 NOTE — Telephone Encounter (Signed)
Rx sent electronically.  

## 2022-01-14 ENCOUNTER — Other Ambulatory Visit: Payer: Self-pay

## 2022-01-14 ENCOUNTER — Ambulatory Visit (INDEPENDENT_AMBULATORY_CARE_PROVIDER_SITE_OTHER): Payer: 59 | Admitting: Internal Medicine

## 2022-01-14 ENCOUNTER — Encounter: Payer: Self-pay | Admitting: Internal Medicine

## 2022-01-14 VITALS — BP 130/84 | HR 82 | Temp 97.1°F | Ht 68.0 in | Wt 256.0 lb

## 2022-01-14 DIAGNOSIS — M25551 Pain in right hip: Secondary | ICD-10-CM | POA: Insufficient documentation

## 2022-01-14 DIAGNOSIS — I10 Essential (primary) hypertension: Secondary | ICD-10-CM

## 2022-01-14 DIAGNOSIS — M25552 Pain in left hip: Secondary | ICD-10-CM

## 2022-01-14 DIAGNOSIS — Z0000 Encounter for general adult medical examination without abnormal findings: Secondary | ICD-10-CM | POA: Diagnosis not present

## 2022-01-14 DIAGNOSIS — E039 Hypothyroidism, unspecified: Secondary | ICD-10-CM | POA: Diagnosis not present

## 2022-01-14 LAB — CBC
HCT: 41.9 % (ref 39.0–52.0)
Hemoglobin: 14.2 g/dL (ref 13.0–17.0)
MCHC: 33.9 g/dL (ref 30.0–36.0)
MCV: 87.4 fl (ref 78.0–100.0)
Platelets: 223 10*3/uL (ref 150.0–400.0)
RBC: 4.79 Mil/uL (ref 4.22–5.81)
RDW: 13.4 % (ref 11.5–15.5)
WBC: 4.4 10*3/uL (ref 4.0–10.5)

## 2022-01-14 LAB — COMPREHENSIVE METABOLIC PANEL
ALT: 24 U/L (ref 0–53)
AST: 19 U/L (ref 0–37)
Albumin: 4.3 g/dL (ref 3.5–5.2)
Alkaline Phosphatase: 71 U/L (ref 39–117)
BUN: 15 mg/dL (ref 6–23)
CO2: 27 mEq/L (ref 19–32)
Calcium: 8.8 mg/dL (ref 8.4–10.5)
Chloride: 104 mEq/L (ref 96–112)
Creatinine, Ser: 0.78 mg/dL (ref 0.40–1.50)
GFR: 102.21 mL/min (ref >60.00)
Glucose, Bld: 104 mg/dL — ABNORMAL HIGH (ref 70–99)
Potassium: 3.9 mEq/L (ref 3.5–5.1)
Sodium: 138 mEq/L (ref 135–145)
Total Bilirubin: 0.4 mg/dL (ref 0.2–1.2)
Total Protein: 6.9 g/dL (ref 6.0–8.3)

## 2022-01-14 LAB — T4, FREE: Free T4: 1.3 ng/dL (ref 0.60–1.60)

## 2022-01-14 LAB — TSH: TSH: 1.86 u[IU]/mL (ref 0.35–5.50)

## 2022-01-14 MED ORDER — TADALAFIL 20 MG PO TABS: 10.0000 mg | ORAL_TABLET | ORAL | 11 refills | Status: DC | PRN

## 2022-01-14 NOTE — Assessment & Plan Note (Signed)
Has almost no internal rotation ?Will set up with ortho for x-ray and evaluation ?

## 2022-01-14 NOTE — Addendum Note (Signed)
Addended by: Viviana Simpler I on: 01/14/2022 08:27 AM ? ? Modules accepted: Orders ? ?

## 2022-01-14 NOTE — Assessment & Plan Note (Signed)
BP Readings from Last 3 Encounters:  ?01/14/22 130/84  ?01/13/21 128/90  ?09/30/19 124/81  ? ?Controlled on the losartan/HCTZ 50/12.5 ?Will check labs ?

## 2022-01-14 NOTE — Assessment & Plan Note (Signed)
Did lose weight on oral semaglutide but not covered now ?He will explore coverage for injectables ?

## 2022-01-14 NOTE — Assessment & Plan Note (Signed)
Healthy ?Discussed increasing aerobic exercise ?Flu vaccine in the fall---and should have COVID booster ?Defer PSA ?Colon due again at the end of this year ?

## 2022-01-14 NOTE — Patient Instructions (Signed)
You can check to see if injectable semaglutide (weekly) or liraglutide (daily) is covered by your insurance. ?

## 2022-01-14 NOTE — Progress Notes (Signed)
? ?Subjective:  ? ? Patient ID: Victor Ford, male    DOB: February 03, 1971, 51 y.o.   MRN: [id number] ? ?HPI ?Here for physical ? ?Having some trouble with hip and knee pain ?After COVID last summer--seems to be worse ?Has tightness along left thigh---adductors ?Does some squats and other stretching ?Has lost 16#---did take rybelsus for a while--but then wasn't covered (took for 8 months) ? ?Otherwise doing pretty well ? ?Current Outpatient Medications on File Prior to Visit  ?Medication Sig Dispense Refill  ? levothyroxine (SYNTHROID) 175 MCG tablet Take 1 tablet (175 mcg total) by mouth daily before breakfast. 90 tablet 3  ? losartan-hydrochlorothiazide (HYZAAR) 50-12.5 MG tablet Take 1 tablet by mouth daily. 90 tablet 3  ? ?No current facility-administered medications on file prior to visit.  ? ? ?No Known Allergies ? ?Past Medical History:  ?Diagnosis Date  ? Allergy   ? Hyperlipidemia   ? diet controlled/no meds  ? Hypertension   ? Thyroid disease   ? ? ?Past Surgical History:  ?Procedure Laterality Date  ? FINGER FRACTURE SURGERY    ? 1980's Right 2nd/3rd finger repair  ? ? ?Family History  ?Problem Relation Age of Onset  ? Alcohol abuse Father   ? Hypertension Father   ? Dementia Paternal Uncle   ? Diabetes Neg Hx   ? Cancer Neg Hx   ? Heart disease Neg Hx   ? Colon cancer Neg Hx   ? Rectal cancer Neg Hx   ? Stomach cancer Neg Hx   ? ? ?Social History  ? ?Socioeconomic History  ? Marital status: Married  ?  Spouse name: Not on file  ? Number of children: 1  ? Years of education: Not on file  ? Highest education level: Not on file  ?Occupational History  ? Occupation: Nature conservation officer  ?  Comment: [employer]  ?Tobacco Use  ? Smoking status: Former  ?  Packs/day: 1.00  ?  Years: 15.00  ?  Pack years: 15.00  ?  Types: Cigarettes  ?  Passive exposure: Never  ? Smokeless tobacco: Never  ?Vaping Use  ? Vaping Use: Never used  ?Substance and Sexual Activity  ? Alcohol use: Yes  ?  Alcohol/week: 14.0 standard drinks   ?  Types: 14 Cans of beer per week  ? Drug use: No  ? Sexual activity: Yes  ?Other Topics Concern  ? Not on file  ?Social History Narrative  ? Not on file  ? ?Social Determinants of Health  ? ?Financial Resource Strain: Not on file  ?Food Insecurity: Not on file  ?Transportation Needs: Not on file  ?Physical Activity: Not on file  ?Stress: Not on file  ?Social Connections: Not on file  ?Intimate Partner Violence: Not on file  ? ?Review of Systems  ?Constitutional:  Negative for fatigue.  ?     Wears seat belt  ?HENT:  Negative for dental problem, hearing loss and tinnitus.   ?     Keeps up with dentist  ?Eyes:  Negative for visual disturbance.  ?     No diplopia or unilateral vision loss  ?Respiratory:  Negative for cough, chest tightness and shortness of breath.   ?Cardiovascular:  Negative for chest pain, palpitations and leg swelling.  ?Gastrointestinal:  Negative for blood in stool and constipation.  ?     No heartburn  ?Endocrine: Negative for polydipsia and polyuria.  ?Genitourinary:  Negative for difficulty urinating and urgency.  ?  Having ED---wants to try med  ?Musculoskeletal:  Positive for arthralgias. Negative for back pain and joint swelling.  ?Skin:  Negative for rash.  ?     Had some dry spots---improved with lotion  ?Allergic/Immunologic: Positive for environmental allergies. Negative for immunocompromised state.  ?     Uses OTC med with success  ?Neurological:  Negative for dizziness, syncope, light-headedness and headaches.  ?Hematological:  Negative for adenopathy. Does not bruise/bleed easily.  ?Psychiatric/Behavioral:  Negative for dysphoric mood and suicidal ideas. The patient is not nervous/anxious.   ? ?   ?Objective:  ? Physical Exam ?Constitutional:   ?   Appearance: Normal appearance.  ?HENT:  ?   Right Ear: Tympanic membrane and ear canal normal.  ?   Left Ear: Tympanic membrane and ear canal normal.  ?   Mouth/Throat:  ?   Pharynx: No oropharyngeal exudate or posterior oropharyngeal  erythema.  ?Eyes:  ?   Conjunctiva/sclera: Conjunctivae normal.  ?   Pupils: Pupils are equal, round, and reactive to light.  ?Cardiovascular:  ?   Rate and Rhythm: Normal rate and regular rhythm.  ?   Pulses: Normal pulses.  ?   Heart sounds: No murmur heard. ?  No gallop.  ?Pulmonary:  ?   Effort: Pulmonary effort is normal.  ?   Breath sounds: Normal breath sounds. No wheezing or rales.  ?Abdominal:  ?   Palpations: Abdomen is soft.  ?   Tenderness: There is no abdominal tenderness.  ?Musculoskeletal:  ?   Cervical back: Neck supple.  ?   Right lower leg: No edema.  ?   Left lower leg: No edema.  ?Lymphadenopathy:  ?   Cervical: No cervical adenopathy.  ?Skin: ?   Findings: No lesion or rash.  ?Neurological:  ?   General: No focal deficit present.  ?   Mental Status: He is alert and oriented to person, place, and time.  ?Psychiatric:     ?   Mood and Affect: Mood normal.     ?   Behavior: Behavior normal.  ?  ? ? ? ? ?   ?Assessment & Plan:  ? ?

## 2022-01-14 NOTE — Assessment & Plan Note (Signed)
Seems euthyroid ?Will check labs on levothyroxine 175 ?

## 2022-01-28 ENCOUNTER — Other Ambulatory Visit: Payer: Self-pay | Admitting: Internal Medicine

## 2022-01-31 ENCOUNTER — Other Ambulatory Visit: Payer: Self-pay | Admitting: Internal Medicine

## 2022-01-31 ENCOUNTER — Ambulatory Visit (INDEPENDENT_AMBULATORY_CARE_PROVIDER_SITE_OTHER): Payer: 59 | Admitting: Orthopaedic Surgery

## 2022-01-31 ENCOUNTER — Encounter: Payer: Self-pay | Admitting: Orthopaedic Surgery

## 2022-01-31 ENCOUNTER — Ambulatory Visit (INDEPENDENT_AMBULATORY_CARE_PROVIDER_SITE_OTHER): Payer: 59

## 2022-01-31 VITALS — Ht 68.0 in | Wt 262.0 lb

## 2022-01-31 DIAGNOSIS — M1611 Unilateral primary osteoarthritis, right hip: Secondary | ICD-10-CM | POA: Insufficient documentation

## 2022-01-31 DIAGNOSIS — M1612 Unilateral primary osteoarthritis, left hip: Secondary | ICD-10-CM | POA: Insufficient documentation

## 2022-01-31 DIAGNOSIS — M25552 Pain in left hip: Secondary | ICD-10-CM

## 2022-01-31 DIAGNOSIS — M25551 Pain in right hip: Secondary | ICD-10-CM

## 2022-01-31 MED ORDER — TRAMADOL HCL 50 MG PO TABS: 100.0000 mg | ORAL_TABLET | Freq: Four times a day (QID) | ORAL | 0 refills | Status: DC | PRN

## 2022-01-31 NOTE — Progress Notes (Signed)
The patient is a very pleasant and active 51 year old who has been dealing with bilateral hip pain for couple years now.  His pain worsened actually after COVID and after having vaccine.  He continues to limp more and both hips hurt quite a bit in the groin.  He reports decreased range of motion of both hips and significant pain.  This is been slowly getting worse.  He has tried and failed all forms of conservative treatment.  He has been on and off anti-inflammatories as well and those have only helped a little bit.  He does have a history of high blood pressure which is under good control and thyroid disease.  We did review his medications.  There is no current listed fever, chills, nausea, vomiting. ? ?Both hips show significant pain with internal and external rotation and there is significant stiffness and I cannot rotate either hip fully.  His leg lengths are equal. ? ?His BMI today is 39.84.  He is not a diabetic. ? ?An AP pelvis and lateral both hips shows severe end-stage arthritis of both hips.  There is complete loss of the joint space bilaterally and flattening of the femoral heads.  There are periarticular osteophytes around both heads and hip joints as well as sclerotic changes. ? ?He understands he has severe end-stage arthritis in both his hips.  This is detrimentally affecting his mobility, his quality of life and his activities day living.  He is interested in considering hip replacement surgery at some point.  I did go over his x-rays with him in detail.  I went over hip replacement model and gave him a handout about hip replacement surgery.  The risks and benefits of surgery were explained in detail.  I talked about what to expect from his interoperative and postoperative course.  He would likely need to be out of work about 6 to 8 weeks.  All questions and concerns were answered and addressed.  He does have our surgery scheduler's card.  He would have to decide which hip hurts him the worst to  have replaced first.  I will send in some tramadol to help temporize his pain. ?

## 2022-02-16 ENCOUNTER — Other Ambulatory Visit: Payer: Self-pay | Admitting: Orthopaedic Surgery

## 2022-02-28 ENCOUNTER — Encounter: Payer: Self-pay | Admitting: Orthopaedic Surgery

## 2022-02-28 ENCOUNTER — Telehealth: Payer: Self-pay | Admitting: Orthopaedic Surgery

## 2022-02-28 NOTE — Telephone Encounter (Signed)
Lvm for wife to cb to advise ?

## 2022-02-28 NOTE — Telephone Encounter (Signed)
Pt's wife Webb Silversmith requesting a call back from estimated time needed off work for her husband upcoming surgery. She need it to give to her employer for [employer]  Please call pt at [phone]. ?

## 2022-02-28 NOTE — Telephone Encounter (Signed)
[employer] forms received. To [employer]. 

## 2022-02-28 NOTE — Telephone Encounter (Signed)
Please advise 

## 2022-03-01 ENCOUNTER — Telehealth: Payer: Self-pay | Admitting: Orthopaedic Surgery

## 2022-03-01 NOTE — Telephone Encounter (Signed)
Received medical records release form,$25.00 check and disability paperwork/Forwarding to [employer] today  

## 2022-03-09 ENCOUNTER — Other Ambulatory Visit: Payer: Self-pay | Admitting: Orthopaedic Surgery

## 2022-03-09 MED ORDER — TRAMADOL HCL 50 MG PO TABS: 50.0000 mg | ORAL_TABLET | Freq: Four times a day (QID) | ORAL | 0 refills | Status: DC | PRN

## 2022-03-16 ENCOUNTER — Other Ambulatory Visit: Payer: Self-pay

## 2022-03-22 NOTE — Patient Instructions (Addendum)
DUE TO COVID-19 ONLY TWO VISITORS  (aged 53 and older)  ARE ALLOWED TO COME WITH YOU AND STAY IN THE WAITING ROOM ONLY DURING PRE OP AND PROCEDURE.    **NO VISITORS ARE ALLOWED IN THE SHORT STAY AREA OR RECOVERY ROOM!!**  IF YOU WILL BE ADMITTED INTO THE HOSPITAL YOU ARE ALLOWED ONLY FOUR SUPPORT PEOPLE DURING VISITATION HOURS ONLY (7 AM -8PM)   The support person(s) must pass our screening, gel in and out, and wear a mask at all times, including in the patient's room. Patients must also wear a mask when staff or their support person are in the room. Visitors GUEST BADGE MUST BE WORN VISIBLY  One adult visitor may remain with you overnight and MUST be in the room by 8 P.M.     Your procedure is scheduled on: 04/01/22    Report to [facility] Main Entrance    Report to admitting at  8:50 AM   Call this number if you have problems the morning of surgery [phone]   Do not eat food :After Midnight.   After Midnight you may have the following liquids until _8:30_ AM/  DAY OF SURGERY  Water Black Coffee (sugar ok, NO MILK/CREAM OR CREAMERS)  Tea (sugar ok, NO MILK/CREAM OR CREAMERS) regular and decaf                             Plain Jell-O (NO RED)                                           Fruit ices (not with fruit pulp, NO RED)                                     Popsicles (NO RED)                                                                  Juice: apple, WHITE grape, WHITE cranberry Sports drinks like Gatorade (NO RED) Clear broth(vegetable,chicken,beef)                    The day of surgery:  Drink ONE (1) Pre-Surgery Clear Ensure  at 8:15 AM the morning of surgery. Drink in one sitting. Do not sip.  This drink was given to you during your hospital  pre-op appointment visit. Nothing else to drink after completing the  Pre-Surgery Clear Ensure at 8:30 am.          If you have questions, please contact your surgeon's office.       Oral Hygiene is also  important to reduce your risk of infection.                                    Remember - BRUSH YOUR TEETH THE MORNING OF SURGERY WITH YOUR REGULAR TOOTHPASTE    Take these medicines the morning of surgery with A SIP OF WATER: Levothyroxine  You may not have any metal on your body including , jewelry, and body piercing             Do not wear lotions, powders, perfumes/cologne, or deodorant               Men may shave face and neck.   Do not bring valuables to the hospital. [facility].   Contacts, dentures or bridgework may not be worn into surgery.   Bring small overnight bag day of surgery.     Special Instructions: Bring a copy of your healthcare power of attorney and living will documents the day of surgery if you haven't scanned them before.              Please read over the following fact sheets you were given: IF YOU HAVE QUESTIONS ABOUT YOUR PRE-OP INSTRUCTIONS PLEASE CALL [phone]     [facility] - Preparing for Surgery Before surgery, you can play an important role.  Because skin is not sterile, your skin needs to be as free of germs as possible.  You can reduce the number of germs on your skin by washing with CHG (chlorahexidine gluconate) soap before surgery.  CHG is an antiseptic cleaner which kills germs and bonds with the skin to continue killing germs even after washing. Please DO NOT use if you have an allergy to CHG or antibacterial soaps.  If your skin becomes reddened/irritated stop using the CHG and inform your nurse when you arrive at Short Stay.  You may shave your face/neck. Please follow these instructions carefully:  1.  Shower with CHG Soap the night before surgery and the  morning of Surgery.  2.  If you choose to wash your hair, wash your hair first as usual with your  normal  shampoo.  3.  After you shampoo, rinse your hair and body thoroughly to remove the  shampoo.                             4.  Use CHG as you would any other liquid soap.  You can apply chg directly  to the skin and wash                       Gently with a scrungie or clean washcloth.  5.  Apply the CHG Soap to your body ONLY FROM THE NECK DOWN.   Do not use on face/ open                           Wound or open sores. Avoid contact with eyes, ears mouth and genitals (private parts).                       Wash face,  Genitals (private parts) with your normal soap.             6.  Wash thoroughly, paying special attention to the area where your surgery  will be performed.  7.  Thoroughly rinse your body with warm water from the neck down.  8.  DO NOT shower/wash with your normal soap after using and rinsing off  the CHG Soap.                9.  Pat yourself dry with a clean towel.            10.  Wear clean pajamas.            11.  Place clean sheets on your bed the night of your first shower and do not  sleep with pets. Day of Surgery : Do not apply any lotions/deodorants the morning of surgery.  Please wear clean clothes to the hospital/surgery center.  FAILURE TO FOLLOW THESE INSTRUCTIONS MAY RESULT IN THE CANCELLATION OF YOUR SURGERY    ________________________________________________________________________   Incentive Spirometer  An incentive spirometer is a tool that can help keep your lungs clear and active. This tool measures how well you are filling your lungs with each breath. Taking long deep breaths may help reverse or decrease the chance of developing breathing (pulmonary) problems (especially infection) following: A long period of time when you are unable to move or be active. BEFORE THE PROCEDURE  If the spirometer includes an indicator to show your best effort, your nurse or respiratory therapist will set it to a desired goal. If possible, sit up straight or lean slightly forward. Try not to slouch. Hold the incentive spirometer in an upright position. INSTRUCTIONS FOR USE   Sit on the edge of your bed if possible, or sit up as far as you can in bed or on a chair. Hold the incentive spirometer in an upright position. Breathe out normally. Place the mouthpiece in your mouth and seal your lips tightly around it. Breathe in slowly and as deeply as possible, raising the piston or the ball toward the top of the column. Hold your breath for 3-5 seconds or for as long as possible. Allow the piston or ball to fall to the bottom of the column. Remove the mouthpiece from your mouth and breathe out normally. Rest for a few seconds and repeat Steps 1 through 7 at least 10 times every 1-2 hours when you are awake. Take your time and take a few normal breaths between deep breaths. The spirometer may include an indicator to show your best effort. Use the indicator as a goal to work toward during each repetition. After each set of 10 deep breaths, practice coughing to be sure your lungs are clear. If you have an incision (the cut made at the time of surgery), support your incision when coughing by placing a pillow or rolled up towels firmly against it. Once you are able to get out of bed, walk around indoors and cough well. You may stop using the incentive spirometer when instructed by your caregiver.  RISKS AND COMPLICATIONS Take your time so you do not get dizzy or light-headed. If you are in pain, you may need to take or ask for pain medication before doing incentive spirometry. It is harder to take a deep breath if you are having pain. AFTER USE Rest and breathe slowly and easily. It can be helpful to keep track of a log of your progress. Your caregiver can provide you with a simple table to help with this. If you are using the spirometer at home, follow these instructions: [place] IF:  You are having difficultly using the spirometer. You have trouble using the spirometer as often as instructed. Your pain medication is not giving enough relief while using the  spirometer. You develop fever of 100.5 F (38.1 C) or higher. SEEK IMMEDIATE MEDICAL CARE IF:  You cough up bloody sputum that had not been present before. You  develop fever of 102 F (38.9 C) or greater. You develop worsening pain at or near the incision site. MAKE SURE YOU:  Understand these instructions. Will watch your condition. Will get help right away if you are not doing well or get worse. Document Released: 02/20/2007 Document Revised: 01/02/2012 Document Reviewed: 04/23/2007 [facility] Patient Information 2014 [place], [place].   ________________________________________________________________________

## 2022-03-23 ENCOUNTER — Encounter (HOSPITAL_COMMUNITY): Payer: Self-pay

## 2022-03-23 ENCOUNTER — Other Ambulatory Visit: Payer: Self-pay

## 2022-03-23 ENCOUNTER — Encounter (HOSPITAL_COMMUNITY)
Admission: RE | Admit: 2022-03-23 | Discharge: 2022-03-23 | Disposition: A | Discharge status: Home / Self Care | Payer: 59 | Source: Ambulatory Visit | Attending: Orthopaedic Surgery | Admitting: Orthopaedic Surgery

## 2022-03-23 VITALS — BP 164/98 | HR 65 | Temp 98.3°F | Resp 18 | Ht 68.5 in | Wt 257.0 lb

## 2022-03-23 DIAGNOSIS — I10 Essential (primary) hypertension: Secondary | ICD-10-CM | POA: Diagnosis not present

## 2022-03-23 DIAGNOSIS — Z01818 Encounter for other preprocedural examination: Secondary | ICD-10-CM | POA: Diagnosis present

## 2022-03-23 HISTORY — DX: Hypothyroidism, unspecified: E03.9

## 2022-03-23 LAB — CBC
HCT: 42.3 % (ref 39.0–52.0)
Hemoglobin: 14.3 g/dL (ref 13.0–17.0)
MCH: 30 pg (ref 26.0–34.0)
MCHC: 33.8 g/dL (ref 30.0–36.0)
MCV: 88.9 fL (ref 80.0–100.0)
Platelets: 235 10*3/uL (ref 150–400)
RBC: 4.76 MIL/uL (ref 4.22–5.81)
RDW: 13.5 % (ref 11.5–15.5)
WBC: 6.7 10*3/uL (ref 4.0–10.5)
nRBC: 0 % (ref 0.0–0.2)

## 2022-03-23 LAB — BASIC METABOLIC PANEL
Anion gap: 9 (ref 5–15)
BUN: 19 mg/dL (ref 6–20)
CO2: 24 mmol/L (ref 22–32)
Calcium: 9.4 mg/dL (ref 8.9–10.3)
Chloride: 104 mmol/L (ref 98–111)
Creatinine, Ser: 0.53 mg/dL — ABNORMAL LOW (ref 0.61–1.24)
GFR, Estimated: >60 mL/min (ref >60)
Glucose, Bld: 92 mg/dL (ref 70–99)
Potassium: 3.6 mmol/L (ref 3.5–5.1)
Sodium: 137 mmol/L (ref 135–145)

## 2022-03-23 LAB — SURGICAL PCR SCREEN
MRSA, PCR: NEGATIVE
Staphylococcus aureus: NEGATIVE

## 2022-03-23 NOTE — Progress Notes (Signed)
Anesthesia note:  Bowel prep reminder:no  PCP - Dr. Wilhemena Durie Cardiologist -none Other-   Chest x-ray - no EKG - 03/23/22-chart Stress Test - no ECHO - no Cardiac Cath - no  Pacemaker/ICD device last checked:NA  Sleep Study - no  stop bang 5 CPAP -   Pt is pre diabetic-NA Fasting Blood Sugar -  Checks Blood Sugar _____  Blood Thinner:NA Blood Thinner Instructions: Aspirin Instructions: Last Dose:  Anesthesia review: no  Patient denies shortness of breath, fever, cough and chest pain at PAT appointment Pt reports no SOB with activities.   Patient verbalized understanding of instructions that were given to them at the PAT appointment. Patient was also instructed that they will need to review over the PAT instructions again at home before surgery. yes

## 2022-03-29 ENCOUNTER — Other Ambulatory Visit: Payer: Self-pay | Admitting: Orthopaedic Surgery

## 2022-03-31 NOTE — Anesthesia Preprocedure Evaluation (Addendum)
Anesthesia Evaluation  Patient identified by MRN, date of birth, ID band Patient awake    Reviewed: Allergy & Precautions, NPO status , Patient's Chart, lab work & pertinent test results  Airway Mallampati: II  TM Distance: >3 FB Neck ROM: Full    Dental no notable dental hx. (+) Teeth Intact, Dental Advisory Given   Pulmonary former smoker,    Pulmonary exam normal breath sounds clear to auscultation       Cardiovascular hypertension, Normal cardiovascular exam Rhythm:Regular Rate:Normal     Neuro/Psych negative neurological ROS  negative psych ROS   GI/Hepatic Neg liver ROS,   Endo/Other    Renal/GU Lab Results      Component                Value               Date                      CREATININE               0.53 (L)            03/23/2022              K                        3.6                 03/23/2022                          Musculoskeletal  (+) Arthritis ,   Abdominal (+) + obese (BMI 39.8),   Peds  Hematology Lab Results      Component                Value               Date                          HGB                      14.3                03/23/2022                HCT                      42.3                03/23/2022                  PLT                      235                 03/23/2022              Anesthesia Other Findings   Reproductive/Obstetrics                            Anesthesia Physical Anesthesia Plan  ASA: 2  Anesthesia Plan: Spinal   Post-op Pain Management: Regional block* and Minimal or no pain anticipated   Induction:   PONV Risk Score and Plan: 2 and Treatment may vary due to  age or medical condition and Midazolam  Airway Management Planned: Nasal Cannula and Natural Airway  Additional Equipment: None  Intra-op Plan:   Post-operative Plan:   Informed Consent: I have reviewed the patients History and Physical, chart, labs and discussed  the procedure including the risks, benefits and alternatives for the proposed anesthesia with the patient or authorized representative who has indicated his/her understanding and acceptance.     Dental advisory given  Plan Discussed with: CRNA  Anesthesia Plan Comments: (Sp for R THR)       Anesthesia Quick Evaluation

## 2022-03-31 NOTE — H&P (Signed)
TOTAL HIP ADMISSION H&P  Patient is admitted for right total hip arthroplasty.  Subjective:  Chief Complaint: right hip pain  HPI: Victor Ford, 51 y.o. male, has a history of pain and functional disability in the right hip(s) due to arthritis and patient has failed non-surgical conservative treatments for greater than 12 weeks to include NSAID's and/or analgesics, flexibility and strengthening excercises, weight reduction as appropriate, and activity modification.  Onset of symptoms was gradual starting 3 years ago with gradually worsening course since that time.The patient noted no past surgery on the right hip(s).  Patient currently rates pain in the right hip at 10 out of 10 with activity. Patient has night pain, worsening of pain with activity and weight bearing, trendelenberg gait, pain that interfers with activities of daily living, and pain with passive range of motion. Patient has evidence of subchondral sclerosis, periarticular osteophytes, and joint space narrowing by imaging studies. This condition presents safety issues increasing the risk of falls.  There is no current active infection.  Patient Active Problem List   Diagnosis Date Noted   Unilateral primary osteoarthritis, right hip 01/31/2022   Unilateral primary osteoarthritis, left hip 01/31/2022   Bilateral hip pain 01/14/2022   Morbid obesity ([place]) 01/13/2021   Routine general medical examination at a health care facility 01/14/2014   HTN (hypertension) 08/10/2011   ALLERGIC RHINITIS 10/04/2010   Hypothyroidism 12/11/2007   Past Medical History:  Diagnosis Date   Allergy    Hyperlipidemia    diet controlled/no meds   Hypertension    Hypothyroidism     Past Surgical History:  Procedure Laterality Date   COLONOSCOPY  2020   FINGER FRACTURE SURGERY     1980's Right 2nd/3rd finger repair    No current facility-administered medications for this encounter.   Current Outpatient Medications  Medication Sig  Dispense Refill Last Dose   acetaminophen (TYLENOL) 500 MG tablet Take 1,000 mg by mouth in the morning and at bedtime.      Ascorbic Acid (VITAMIN C PO) Take 1 tablet by mouth daily.      cetirizine (ZYRTEC) 10 MG tablet Take 10 mg by mouth daily as needed for allergies.      Cyanocobalamin (B-12 PO) Take 1 tablet by mouth daily.      losartan-hydrochlorothiazide (HYZAAR) 50-12.5 MG tablet TAKE 1 TABLET BY MOUTH EVERY DAY 90 tablet 3    tadalafil (CIALIS) 20 MG tablet Take 0.5-1 tablets (10-20 mg total) by mouth every other day as needed for erectile dysfunction. 10 tablet 11    trolamine salicylate (ASPERCREME) 10 % cream Apply 1 application. topically as needed for muscle pain.      levothyroxine (SYNTHROID) 175 MCG tablet TAKE 1 TABLET BY MOUTH DAILY BEFORE BREAKFAST. 90 tablet 3    traMADol (ULTRAM) 50 MG tablet Take 1 tablet (50 mg total) by mouth 2 (two) times daily. 30 tablet 0    No Known Allergies  Social History   Tobacco Use   Smoking status: Former    Packs/day: 1.00    Years: 15.00    Total pack years: 15.00    Types: Cigarettes    Quit date: 2002    Years since quitting: 21.4    Passive exposure: Never   Smokeless tobacco: Never  Substance Use Topics   Alcohol use: Yes    Alcohol/week: 6.0 standard drinks of alcohol    Types: 6 Standard drinks or equivalent per week    Family History  Problem Relation Age of  Onset   Alcohol abuse Father    Hypertension Father    Dementia Paternal Uncle    Diabetes Neg Hx    Cancer Neg Hx    Heart disease Neg Hx    Colon cancer Neg Hx    Rectal cancer Neg Hx    Stomach cancer Neg Hx      Review of Systems  Musculoskeletal:  Positive for gait problem.  All other systems reviewed and are negative.   Objective:  Physical Exam Vitals reviewed.  Constitutional:      Appearance: Normal appearance.  HENT:     Head: Normocephalic and atraumatic.  Eyes:     Extraocular Movements: Extraocular movements intact.     Pupils:  Pupils are equal, round, and reactive to light.  Cardiovascular:     Rate and Rhythm: Normal rate and regular rhythm.  Pulmonary:     Effort: Pulmonary effort is normal.     Breath sounds: Normal breath sounds.  Abdominal:     Palpations: Abdomen is soft.  Musculoskeletal:     Cervical back: Normal range of motion and neck supple.     Right hip: Tenderness and bony tenderness present. Decreased range of motion. Decreased strength.  Neurological:     Mental Status: He is alert and oriented to person, place, and time.  Psychiatric:        Behavior: Behavior normal.     Vital signs in last 24 hours:    Labs:   Estimated body mass index is 38.51 kg/m as calculated from the following:   Height as of 03/23/22: 5' 8.5" (1.74 m).   Weight as of 03/23/22: 116.6 kg.   Imaging Review Plain radiographs demonstrate severe degenerative joint disease of the right hip(s). The bone quality appears to be excellent for age and reported activity level.      Assessment/Plan:  End stage arthritis, right hip(s)  The patient history, physical examination, clinical judgement of the provider and imaging studies are consistent with end stage degenerative joint disease of the right hip(s) and total hip arthroplasty is deemed medically necessary. The treatment options including medical management, injection therapy, arthroscopy and arthroplasty were discussed at length. The risks and benefits of total hip arthroplasty were presented and reviewed. The risks due to aseptic loosening, infection, stiffness, dislocation/subluxation,  thromboembolic complications and other imponderables were discussed.  The patient acknowledged the explanation, agreed to proceed with the plan and consent was signed. Patient is being admitted for inpatient treatment for surgery, pain control, PT, OT, prophylactic antibiotics, VTE prophylaxis, progressive ambulation and ADL's and discharge planning.The patient is planning to be  discharged home with home health services

## 2022-04-01 ENCOUNTER — Ambulatory Visit (HOSPITAL_COMMUNITY): Payer: 59 | Admitting: Certified Registered Nurse Anesthetist

## 2022-04-01 ENCOUNTER — Encounter (HOSPITAL_COMMUNITY): Payer: Self-pay | Admitting: Orthopaedic Surgery

## 2022-04-01 ENCOUNTER — Ambulatory Visit (HOSPITAL_BASED_OUTPATIENT_CLINIC_OR_DEPARTMENT_OTHER): Payer: 59 | Admitting: Certified Registered Nurse Anesthetist

## 2022-04-01 ENCOUNTER — Ambulatory Visit (HOSPITAL_COMMUNITY): Payer: 59

## 2022-04-01 ENCOUNTER — Observation Stay (HOSPITAL_COMMUNITY)
Admission: RE | Admit: 2022-04-01 | Discharge: 2022-04-02 | Disposition: A | Discharge status: Home Health | Payer: 59 | Source: Ambulatory Visit | Attending: Orthopaedic Surgery | Admitting: Orthopaedic Surgery

## 2022-04-01 ENCOUNTER — Observation Stay (HOSPITAL_COMMUNITY): Payer: 59

## 2022-04-01 ENCOUNTER — Other Ambulatory Visit: Payer: Self-pay

## 2022-04-01 ENCOUNTER — Encounter (HOSPITAL_COMMUNITY)
Admission: RE | Disposition: A | Discharge status: Home Health | Payer: Self-pay | Source: Ambulatory Visit | Attending: Orthopaedic Surgery

## 2022-04-01 DIAGNOSIS — E039 Hypothyroidism, unspecified: Secondary | ICD-10-CM | POA: Insufficient documentation

## 2022-04-01 DIAGNOSIS — Z96641 Presence of right artificial hip joint: Secondary | ICD-10-CM

## 2022-04-01 DIAGNOSIS — M1611 Unilateral primary osteoarthritis, right hip: Principal | ICD-10-CM

## 2022-04-01 DIAGNOSIS — Z87891 Personal history of nicotine dependence: Secondary | ICD-10-CM | POA: Diagnosis not present

## 2022-04-01 DIAGNOSIS — Z79899 Other long term (current) drug therapy: Secondary | ICD-10-CM | POA: Diagnosis not present

## 2022-04-01 DIAGNOSIS — Z01818 Encounter for other preprocedural examination: Secondary | ICD-10-CM

## 2022-04-01 DIAGNOSIS — I10 Essential (primary) hypertension: Secondary | ICD-10-CM

## 2022-04-01 HISTORY — PX: TOTAL HIP ARTHROPLASTY: SHX124

## 2022-04-01 LAB — TYPE AND SCREEN
ABO/RH(D): A POS
Antibody Screen: NEGATIVE

## 2022-04-01 LAB — ABO/RH: ABO/RH(D): A POS

## 2022-04-01 SURGERY — ARTHROPLASTY, HIP, TOTAL, ANTERIOR APPROACH
Anesthesia: Spinal | Site: Hip | Laterality: Right

## 2022-04-01 MED ORDER — ORAL CARE MOUTH RINSE: 15.0000 mL | Freq: Once | OROMUCOSAL | Status: AC

## 2022-04-01 MED ORDER — HYDROCHLOROTHIAZIDE 12.5 MG PO TABS: 12.5000 mg | ORAL_TABLET | Freq: Every day | ORAL | Status: DC

## 2022-04-01 MED ORDER — ACETAMINOPHEN 325 MG PO TABS: 325.0000 mg | ORAL_TABLET | Freq: Four times a day (QID) | ORAL | Status: DC | PRN

## 2022-04-01 MED ORDER — METHOCARBAMOL 500 MG PO TABS
500.0000 mg | ORAL_TABLET | Freq: Four times a day (QID) | ORAL | Status: DC | PRN
  Administered 2022-04-01 – 2022-04-02 (×3): 500 mg via ORAL
  Filled 2022-04-01 (×3): qty 1

## 2022-04-01 MED ORDER — PHENOL 1.4 % MT LIQD: 1.0000 | OROMUCOSAL | Status: DC | PRN

## 2022-04-01 MED ORDER — PHENYLEPHRINE 80 MCG/ML (10ML) SYRINGE FOR IV PUSH (FOR BLOOD PRESSURE SUPPORT)
PREFILLED_SYRINGE | INTRAVENOUS | Status: AC
  Filled 2022-04-01: qty 10

## 2022-04-01 MED ORDER — FENTANYL CITRATE (PF) 100 MCG/2ML IJ SOLN
INTRAMUSCULAR | Status: DC | PRN
Start: 2022-04-01 — End: 2022-04-01
  Administered 2022-04-01 (×2): 50 ug via INTRAVENOUS

## 2022-04-01 MED ORDER — OXYCODONE HCL 5 MG PO TABS: 5.0000 mg | ORAL_TABLET | Freq: Once | ORAL | Status: DC | PRN

## 2022-04-01 MED ORDER — HYDROMORPHONE HCL 1 MG/ML IJ SOLN: 0.2500 mg | INTRAMUSCULAR | Status: DC | PRN

## 2022-04-01 MED ORDER — 0.9 % SODIUM CHLORIDE (POUR BTL) OPTIME
TOPICAL | Status: DC | PRN
  Administered 2022-04-01: 1000 mL

## 2022-04-01 MED ORDER — SODIUM CHLORIDE 0.9 % IR SOLN
Status: DC | PRN
  Administered 2022-04-01: 1000 mL

## 2022-04-01 MED ORDER — PHENYLEPHRINE HCL-NACL 20-0.9 MG/250ML-% IV SOLN
INTRAVENOUS | Status: DC | PRN
  Administered 2022-04-01: 35 ug/min via INTRAVENOUS

## 2022-04-01 MED ORDER — AMISULPRIDE (ANTIEMETIC) 5 MG/2ML IV SOLN: 10.0000 mg | Freq: Once | INTRAVENOUS | Status: DC | PRN

## 2022-04-01 MED ORDER — ALUM & MAG HYDROXIDE-SIMETH 200-200-20 MG/5ML PO SUSP: 30.0000 mL | ORAL | Status: DC | PRN

## 2022-04-01 MED ORDER — PROPOFOL 1000 MG/100ML IV EMUL
INTRAVENOUS | Status: AC
Start: 2022-04-01 — End: ?
  Filled 2022-04-01: qty 100

## 2022-04-01 MED ORDER — DOCUSATE SODIUM 100 MG PO CAPS
100.0000 mg | ORAL_CAPSULE | Freq: Two times a day (BID) | ORAL | Status: DC
  Administered 2022-04-01 – 2022-04-02 (×2): 100 mg via ORAL
  Filled 2022-04-01 (×2): qty 1

## 2022-04-01 MED ORDER — MENTHOL 3 MG MT LOZG: 1.0000 | LOZENGE | OROMUCOSAL | Status: DC | PRN

## 2022-04-01 MED ORDER — CEFAZOLIN SODIUM-DEXTROSE 2-4 GM/100ML-% IV SOLN
2.0000 g | INTRAVENOUS | Status: AC
  Administered 2022-04-01: 2 g via INTRAVENOUS
  Filled 2022-04-01: qty 100

## 2022-04-01 MED ORDER — ONDANSETRON HCL 4 MG/2ML IJ SOLN: 4.0000 mg | Freq: Once | INTRAMUSCULAR | Status: DC | PRN

## 2022-04-01 MED ORDER — POVIDONE-IODINE 10 % EX SWAB
2.0000 "application " | Freq: Once | CUTANEOUS | Status: AC
  Administered 2022-04-01: 2 via TOPICAL

## 2022-04-01 MED ORDER — ASPIRIN 81 MG PO CHEW
81.0000 mg | CHEWABLE_TABLET | Freq: Two times a day (BID) | ORAL | Status: DC
  Administered 2022-04-01 – 2022-04-02 (×2): 81 mg via ORAL
  Filled 2022-04-01 (×2): qty 1

## 2022-04-01 MED ORDER — TRANEXAMIC ACID-NACL 1000-0.7 MG/100ML-% IV SOLN
1000.0000 mg | INTRAVENOUS | Status: AC
  Administered 2022-04-01: 1000 mg via INTRAVENOUS

## 2022-04-01 MED ORDER — SODIUM CHLORIDE 0.9 % IV SOLN: INTRAVENOUS | Status: DC

## 2022-04-01 MED ORDER — OXYCODONE HCL 5 MG/5ML PO SOLN: 5.0000 mg | Freq: Once | ORAL | Status: DC | PRN

## 2022-04-01 MED ORDER — ONDANSETRON HCL 4 MG/2ML IJ SOLN
INTRAMUSCULAR | Status: DC | PRN
  Administered 2022-04-01: 4 mg via INTRAVENOUS

## 2022-04-01 MED ORDER — PROPOFOL 10 MG/ML IV BOLUS
INTRAVENOUS | Status: DC | PRN
  Administered 2022-04-01: 30 mg via INTRAVENOUS
  Administered 2022-04-01 (×2): 20 mg via INTRAVENOUS

## 2022-04-01 MED ORDER — MIDAZOLAM HCL 5 MG/5ML IJ SOLN
INTRAMUSCULAR | Status: DC | PRN
  Administered 2022-04-01: 2 mg via INTRAVENOUS

## 2022-04-01 MED ORDER — LOSARTAN POTASSIUM-HCTZ 50-12.5 MG PO TABS: 1.0000 | ORAL_TABLET | Freq: Every day | ORAL | Status: DC

## 2022-04-01 MED ORDER — OXYCODONE HCL 5 MG PO TABS
10.0000 mg | ORAL_TABLET | ORAL | Status: DC | PRN
  Administered 2022-04-01: 15 mg via ORAL
  Administered 2022-04-01: 10 mg via ORAL
  Administered 2022-04-02 (×2): 15 mg via ORAL
  Filled 2022-04-01: qty 3
  Filled 2022-04-01: qty 2
  Filled 2022-04-01 (×2): qty 3

## 2022-04-01 MED ORDER — DIPHENHYDRAMINE HCL 12.5 MG/5ML PO ELIX: 12.5000 mg | ORAL_SOLUTION | ORAL | Status: DC | PRN

## 2022-04-01 MED ORDER — ONDANSETRON HCL 4 MG PO TABS: 4.0000 mg | ORAL_TABLET | Freq: Four times a day (QID) | ORAL | Status: DC | PRN

## 2022-04-01 MED ORDER — STERILE WATER FOR IRRIGATION IR SOLN
Status: DC | PRN
  Administered 2022-04-01: 2000 mL

## 2022-04-01 MED ORDER — OXYCODONE HCL 5 MG PO TABS: 5.0000 mg | ORAL_TABLET | ORAL | Status: DC | PRN

## 2022-04-01 MED ORDER — BUPIVACAINE IN DEXTROSE 0.75-8.25 % IT SOLN
INTRATHECAL | Status: DC | PRN
  Administered 2022-04-01: 13.5 mg via INTRATHECAL

## 2022-04-01 MED ORDER — TRANEXAMIC ACID-NACL 1000-0.7 MG/100ML-% IV SOLN
INTRAVENOUS | Status: AC
  Filled 2022-04-01: qty 100

## 2022-04-01 MED ORDER — MIDAZOLAM HCL 2 MG/2ML IJ SOLN
INTRAMUSCULAR | Status: AC
Start: 2022-04-01 — End: ?
  Filled 2022-04-01: qty 2

## 2022-04-01 MED ORDER — ONDANSETRON HCL 4 MG/2ML IJ SOLN: 4.0000 mg | Freq: Four times a day (QID) | INTRAMUSCULAR | Status: DC | PRN

## 2022-04-01 MED ORDER — CEFAZOLIN SODIUM-DEXTROSE 1-4 GM/50ML-% IV SOLN
1.0000 g | Freq: Four times a day (QID) | INTRAVENOUS | Status: AC
  Administered 2022-04-01 (×2): 1 g via INTRAVENOUS
  Filled 2022-04-01 (×2): qty 50

## 2022-04-01 MED ORDER — LACTATED RINGERS IV SOLN: INTRAVENOUS | Status: DC

## 2022-04-01 MED ORDER — PROPOFOL 500 MG/50ML IV EMUL
INTRAVENOUS | Status: DC | PRN
  Administered 2022-04-01: 75 ug/kg/min via INTRAVENOUS

## 2022-04-01 MED ORDER — LOSARTAN POTASSIUM 50 MG PO TABS: 50.0000 mg | ORAL_TABLET | Freq: Every day | ORAL | Status: DC

## 2022-04-01 MED ORDER — CHLORHEXIDINE GLUCONATE 0.12 % MT SOLN
15.0000 mL | Freq: Once | OROMUCOSAL | Status: AC
  Administered 2022-04-01: 15 mL via OROMUCOSAL

## 2022-04-01 MED ORDER — ACETAMINOPHEN 10 MG/ML IV SOLN: 1000.0000 mg | Freq: Once | INTRAVENOUS | Status: DC | PRN

## 2022-04-01 MED ORDER — METHOCARBAMOL 500 MG IVPB - SIMPLE MED: 500.0000 mg | Freq: Four times a day (QID) | INTRAVENOUS | Status: DC | PRN

## 2022-04-01 MED ORDER — HYDROMORPHONE HCL 1 MG/ML IJ SOLN: 0.5000 mg | INTRAMUSCULAR | Status: DC | PRN

## 2022-04-01 MED ORDER — HYDROCHLOROTHIAZIDE 12.5 MG PO TABS
12.5000 mg | ORAL_TABLET | Freq: Every day | ORAL | Status: DC
  Administered 2022-04-01 – 2022-04-02 (×2): 12.5 mg via ORAL
  Filled 2022-04-01 (×2): qty 1

## 2022-04-01 MED ORDER — FENTANYL CITRATE (PF) 100 MCG/2ML IJ SOLN
INTRAMUSCULAR | Status: AC
  Filled 2022-04-01: qty 2

## 2022-04-01 MED ORDER — LEVOTHYROXINE SODIUM 125 MCG PO TABS
175.0000 ug | ORAL_TABLET | Freq: Every day | ORAL | Status: DC
  Administered 2022-04-02: 175 ug via ORAL
  Filled 2022-04-01: qty 1

## 2022-04-01 MED ORDER — PHENYLEPHRINE 80 MCG/ML (10ML) SYRINGE FOR IV PUSH (FOR BLOOD PRESSURE SUPPORT)
PREFILLED_SYRINGE | INTRAVENOUS | Status: DC | PRN
  Administered 2022-04-01 (×3): 80 ug via INTRAVENOUS

## 2022-04-01 MED ORDER — PANTOPRAZOLE SODIUM 40 MG PO TBEC
40.0000 mg | DELAYED_RELEASE_TABLET | Freq: Every day | ORAL | Status: DC
  Administered 2022-04-01 – 2022-04-02 (×2): 40 mg via ORAL
  Filled 2022-04-01 (×2): qty 1

## 2022-04-01 MED ORDER — METOCLOPRAMIDE HCL 5 MG PO TABS: 5.0000 mg | ORAL_TABLET | Freq: Three times a day (TID) | ORAL | Status: DC | PRN

## 2022-04-01 MED ORDER — METOCLOPRAMIDE HCL 5 MG/ML IJ SOLN: 5.0000 mg | Freq: Three times a day (TID) | INTRAMUSCULAR | Status: DC | PRN

## 2022-04-01 MED ORDER — LOSARTAN POTASSIUM 50 MG PO TABS
50.0000 mg | ORAL_TABLET | Freq: Every day | ORAL | Status: DC
  Administered 2022-04-01 – 2022-04-02 (×2): 50 mg via ORAL
  Filled 2022-04-01 (×2): qty 1

## 2022-04-01 MED ORDER — DEXAMETHASONE SODIUM PHOSPHATE 10 MG/ML IJ SOLN
INTRAMUSCULAR | Status: DC | PRN
  Administered 2022-04-01: 8 mg via INTRAVENOUS

## 2022-04-01 SURGICAL SUPPLY — 41 items
BAG COUNTER SPONGE SURGICOUNT (BAG) ×2 IMPLANT
BAG ZIPLOCK 12X15 (MISCELLANEOUS) IMPLANT
BENZOIN TINCTURE PRP APPL 2/3 (GAUZE/BANDAGES/DRESSINGS) IMPLANT
BLADE SAW SGTL 18X1.27X75 (BLADE) ×2 IMPLANT
COVER PERINEAL POST (MISCELLANEOUS) ×2 IMPLANT
COVER SURGICAL LIGHT HANDLE (MISCELLANEOUS) ×2 IMPLANT
DRAPE FOOT SWITCH (DRAPES) ×2 IMPLANT
DRAPE STERI IOBAN 125X83 (DRAPES) ×2 IMPLANT
DRAPE U-SHAPE 47X51 STRL (DRAPES) ×4 IMPLANT
DRSG AQUACEL AG ADV 3.5X10 (GAUZE/BANDAGES/DRESSINGS) ×2 IMPLANT
DURAPREP 26ML APPLICATOR (WOUND CARE) ×2 IMPLANT
ELECT REM PT RETURN 15FT ADLT (MISCELLANEOUS) ×2 IMPLANT
FEM STEM 12/14 TAPER SZ 4 HIP (Orthopedic Implant) ×2 IMPLANT
FEMORAL STEM 12/14 TPR SZ4 HIP (Orthopedic Implant) IMPLANT
GAUZE XEROFORM 1X8 LF (GAUZE/BANDAGES/DRESSINGS) ×2 IMPLANT
GLOVE BIO SURGEON STRL SZ7.5 (GLOVE) ×2 IMPLANT
GLOVE BIOGEL PI IND STRL 8 (GLOVE) ×2 IMPLANT
GLOVE BIOGEL PI INDICATOR 8 (GLOVE) ×2
GLOVE ECLIPSE 8.0 STRL XLNG CF (GLOVE) ×2 IMPLANT
GOWN STRL REUS W/ TWL XL LVL3 (GOWN DISPOSABLE) ×2 IMPLANT
GOWN STRL REUS W/TWL XL LVL3 (GOWN DISPOSABLE) ×4
HANDPIECE INTERPULSE COAX TIP (DISPOSABLE) ×2
HEAD CERAMIC DELTA 36 PLUS 1.5 (Hips) ×1 IMPLANT
HOLDER FOLEY CATH W/STRAP (MISCELLANEOUS) ×2 IMPLANT
KIT TURNOVER KIT A (KITS) ×1 IMPLANT
LINER NEUTRAL 52X36MM PLUS 4 (Liner) ×1 IMPLANT
PACK ANTERIOR HIP CUSTOM (KITS) ×2 IMPLANT
PIN SECTOR W/GRIP ACE CUP 52MM (Hips) ×1 IMPLANT
SCREW 6.5MMX25MM (Screw) ×1 IMPLANT
SET HNDPC FAN SPRY TIP SCT (DISPOSABLE) ×1 IMPLANT
STAPLER VISISTAT 35W (STAPLE) ×1 IMPLANT
STRIP CLOSURE SKIN 1/2X4 (GAUZE/BANDAGES/DRESSINGS) IMPLANT
SUT ETHIBOND NAB CT1 #1 30IN (SUTURE) ×2 IMPLANT
SUT ETHILON 2 0 PS N (SUTURE) IMPLANT
SUT MNCRL AB 4-0 PS2 18 (SUTURE) IMPLANT
SUT VIC AB 0 CT1 36 (SUTURE) ×2 IMPLANT
SUT VIC AB 1 CT1 36 (SUTURE) ×2 IMPLANT
SUT VIC AB 2-0 CT1 27 (SUTURE) ×4
SUT VIC AB 2-0 CT1 TAPERPNT 27 (SUTURE) ×2 IMPLANT
TRAY FOLEY MTR SLVR 16FR STAT (SET/KITS/TRAYS/PACK) ×1 IMPLANT
YANKAUER SUCT BULB TIP NO VENT (SUCTIONS) ×2 IMPLANT

## 2022-04-01 NOTE — Consult Note (Signed)
Initial Consultation Note   Patient: Victor Ford KGM:[id number] DOB: [birth year] PCP: Venia Carbon, MD DOA: 04/01/2022 DOS: the patient was seen and examined on 04/01/2022 Primary service: Mcarthur Rossetti  Referring physician: Jean Rosenthal Reason for consult: Elevated blood pressure HPI: Patient is a pleasant 51 y/o male with past medical history significant for Hypertension,hypothyroidism, OA of hip and knee who is s/p right THR. Patient course complicated by elevated [id number]  post op. Patient notes he has significant pain in hip , he noted no chest pain, n/v/d /vision changes, but noted mild tension HA which resolved with pain medications.  Of note s/p pain medication patient bp decreased to 142/82 , prior to getting his scheduled BP medications.    FH HTN  DMII Colon CA   SH -occasional ETOH , no hx of w/d sxs  -no tobacco     Assessment/Plan:  Elevated blood pressure  -insetting of uncontrolled post op pain  -at this time it appears elevated bp related to pain as bp improved s/p pain medications  -agree with resuming patient bp medications at this time  - will place prn , however prn should not be used unless patient has adequate pain control   Hypothyroid  -please resume home regimen   S/p RTHR  -post op surgical care per ortho  -post op pain management per ortho   Thank you for this consult please will sign off at this time please call there are further questions        Review of Systems: As mentioned in the history of present illness. All other systems reviewed and are negative. Past Medical History:  Diagnosis Date   Allergy    Hyperlipidemia    diet controlled/no meds   Hypertension    Hypothyroidism    Past Surgical History:  Procedure Laterality Date   COLONOSCOPY  2020   FINGER FRACTURE SURGERY     1980's Right 2nd/3rd finger repair   Social History:  reports that he quit smoking about 21 years ago. His smoking use  included cigarettes. He has a 15.00 pack-year smoking history. He has never been exposed to tobacco smoke. He has never used smokeless tobacco. He reports current alcohol use of about 6.0 standard drinks of alcohol per week. He reports that he does not use drugs.  No Known Allergies  Family History  Problem Relation Age of Onset   Alcohol abuse Father    Hypertension Father    Dementia Paternal Uncle    Diabetes Neg Hx    Cancer Neg Hx    Heart disease Neg Hx    Colon cancer Neg Hx    Rectal cancer Neg Hx    Stomach cancer Neg Hx     Prior to Admission medications   Medication Sig Start Date End Date Taking? Authorizing Provider  acetaminophen (TYLENOL) 500 MG tablet Take 1,000 mg by mouth in the morning and at bedtime.   Yes [provider]  Ascorbic Acid (VITAMIN C PO) Take 1 tablet by mouth daily.   Yes [provider]  cetirizine (ZYRTEC) 10 MG tablet Take 10 mg by mouth daily as needed for allergies.   Yes [provider]  Cyanocobalamin (B-12 PO) Take 1 tablet by mouth daily.   Yes [provider]  levothyroxine (SYNTHROID) 175 MCG tablet TAKE 1 TABLET BY MOUTH DAILY BEFORE BREAKFAST. 01/31/22  Yes Venia Carbon, MD  losartan-hydrochlorothiazide (HYZAAR) 50-12.5 MG tablet TAKE 1 TABLET BY MOUTH EVERY DAY 01/28/22  Yes Venia Carbon, MD  tadalafil (CIALIS) 20 MG tablet Take 0.5-1 tablets (10-20 mg total) by mouth every other day as needed for erectile dysfunction. 01/14/22  Yes Venia Carbon, MD  traMADol (ULTRAM) 50 MG tablet Take 1 tablet (50 mg total) by mouth 2 (two) times daily. 03/30/22  Yes Mcarthur Rossetti, MD  trolamine salicylate (ASPERCREME) 10 % cream Apply 1 application. topically as needed for muscle pain.   Yes [provider]    Physical Exam: Vitals:   04/01/22 1252 04/01/22 1453 04/01/22 1834 04/01/22 2151  BP:  (!) 169/107 (!) 142/82 (!) 174/97  Pulse:  95 73 88  Resp:  '14 16 20  '$ Temp:  98.1 F (36.7  C) 97.9 F (36.6 C) 98 F (36.7 C)  TempSrc:  Oral  Oral  SpO2:  94% 100% 94%  Weight: 116.5 kg     Height: 5' 8.5" (1.74 m)      GEN: NAD CV: s1s2 reg  Lungs: cta b/l  Abd:+bs ntnd Ext: noted THR on the right , no edema  Neuro: non-focal, alert and oriented x 4   Data Reviewed:  Blood pressure readings  {Family Communication: none at beside  Author: Clance Boll, MD 04/01/2022 9:57 PM  For on call review [email or web address].

## 2022-04-01 NOTE — Interval H&P Note (Signed)
History and Physical Interval Note: The patient understands that he is here today for right hip replacement to treat his severe right hip osteoarthritis.  There has been no acute or interval changes in medical status.  Please see H&P.  The risks and benefits of surgery been discussed in detail and informed consent is obtained.  The right operative hip has been marked.  04/01/2022 8:24 AM  Victor Ford  has presented today for surgery, with the diagnosis of OSTEOARTHRITIS RIGHT HIP.  The various methods of treatment have been discussed with the patient and family. After consideration of risks, benefits and other options for treatment, the patient has consented to  Procedure(s): RIGHT TOTAL HIP ARTHROPLASTY ANTERIOR APPROACH (Right) as a surgical intervention.  The patient's history has been reviewed, patient examined, no change in status, stable for surgery.  I have reviewed the patient's chart and labs.  Questions were answered to the patient's satisfaction.     Mcarthur Rossetti

## 2022-04-01 NOTE — Op Note (Signed)
NAME: LAFE, CLERK MEDICAL RECORD NO: [id number] ACCOUNT NO: [id number] DATE OF BIRTH: [birth year] FACILITY: [facility]: [facility] PHYSICIAN: Lind Guest. Ninfa Linden, MD  Operative Report   DATE OF PROCEDURE: 04/01/2022  PREOPERATIVE DIAGNOSIS:  Primary osteoarthritis and degenerative joint disease, right hip.  POSTOPERATIVE DIAGNOSIS:  Primary osteoarthritis and degenerative joint disease, right hip.  PROCEDURE:  Right total hip arthroplasty through direct anterior approach.  IMPLANTS:  DePuy sector Gription acetabular component, size 52 with a single screw in the dome, size 36+4 neutral polyethylene liner, size 4 ACTIS femoral component with high offset, size 36+1.5 ceramic hip ball.  SURGEON:  Lind Guest. Ninfa Linden, MD  ASSISTANT:  Benita Stabile, PA-C.  ANESTHESIA:  Spinal.  ANTIBIOTICS:  2 g IV Ancef.  ESTIMATED BLOOD LOSS:  270 mL  COMPLICATIONS:  None.  INDICATIONS:  The patient is a 51 year old gentleman with unfortunate debilitating arthritis in both his hips as well documented on clinical exam and x-ray findings showing bone-on-bone wear of both hips.  He has tried and failed conservative treatment  for a long period of time.  At this point, his both hips hurt daily and they are detrimentally affecting his mobility, his quality of life and his activities of daily living.  At this point, we have recommended hip replacement surgery.  He has elected to  proceed with a right hip first because that is most painful, but again both of them have severe end-stage arthritis.  We talked in length and detail about the risk of acute blood loss anemia, nerve or vessel injury, fracture, infection, dislocation,  implant failure, leg length differences and skin and soft tissue issues.  We talked about our goals being decreased pain, improve mobility and overall improve quality of life.  DESCRIPTION OF PROCEDURE:  After informed consent was obtained, appropriate right hip was marked.  He  was brought to the operating room and sat up on the stretcher where spinal anesthesia was obtained.  He was laid in the supine position on the  stretcher.  Foley catheter was placed and traction boots were placed on both his feet.  Next, he was placed supine on the Hana fracture table, the perineal post in place and both legs in line with skeletal traction devices.  There has been no traction  applied.  His right operative hip was prepped and draped with DuraPrep and sterile drapes.  A timeout was called.  He was identified as correct patient, correct right hip.  I then made an incision just inferior and posterior to the anterior superior  iliac spine and carried this slightly obliquely down the leg.  I dissected down tensor fascia lata muscle.  Tensor fascia was then divided longitudinally to proceed with direct anterior approach to the hip.  We identified and cauterized circumflex  vessels and then identified the hip capsule, opened the hip capsule in L-type format finding a moderate joint effusion and significant periarticular osteophytes around the lateral femoral head and neck.  I placed Cobra retractors around the medial and  lateral femoral neck and made a femoral neck cut with an oscillating saw just proximal to the lesser trochanter and completed this with an osteotome.  We placed a corkscrew guide in the femoral head and removed the femoral head in its entirety and found  a wide area devoid of cartilage.  I then placed a bent Hohmann over the medial acetabular rim and removed remnants of acetabular labrum and other debris.  I then began reaming under direct visualization from a  size 43 reamer in a stepwise increments  going up to size 51 reamer with all reamers placed under direct visualization.  Last reamers placed under direct fluoroscopy, so I could obtain my depth of reaming, my inclination and anteversion.  I then placed real DePuy sector Gription acetabular  component size 52 and a single  dome screw.  I then went with a 36+4 neutral polyethylene liner for that size 52 acetabular component.  Attention was then turned to the femur.  With the leg externally rotated to 120 degrees and extended and adducted we  were able to place a Mueller retractor medially and Hohmann retractor behind the greater trochanter.  We released lateral joint capsule and used a box cutting osteotome to enter femoral canal and a rongeur to lateralize, we then began broaching using the  ACTIS broaching system from a size 0 going up to a size 4.  With a size 4 in place, we trialed a high offset femoral neck based off his anatomy and a 36+1.5 hip ball. We reduced this in acetabulum.  We were pleased with his stability and offset.  We did  lengthen him some, but we know that we were going to be coming back to his other hip at some point and lengthening is appropriate in this scenario.  We then dislocated the hip and removed the trial components.  We placed the real ACTIS femoral component, size 4  with high offset and the real 36+1.5 hip ball, which was ceramic.  Again, we reduced this in acetabulum and I was pleased with stability, assessed mechanically and radiographically as well as implant placement.  We then irrigated the soft tissue with  normal saline solution.  The joint capsule was closed with interrupted #1 Ethibond suture followed by #1 Vicryl to close the tensor fascia.  0 Vicryl was used to close deep tissue and 2-0 Vicryl was used to close subcutaneous tissue.  The skin was closed  with staples.  An Aquacel dressing was applied.  He was taken off the Hana table and taken to recovery room in stable condition with all final counts being correct.  No complications noted.  Of note, Benita Stabile, PA-C did assist during the entire case  from beginning to end.  His assistance was crucial for facilitating all aspects of this case and was medically necessary for retracting soft tissues, helping guide implant placement and  layered closure of the wound.   PUS D: 04/01/2022 10:54:22 am T: 04/01/2022 2:37:00 pm  JOB: [id number]/ [id number]

## 2022-04-01 NOTE — TOC Transition Note (Signed)
Transition of Care [facility]) - CM/SW Discharge Note   Patient Details  Name: Victor Ford MRN: [id number] Date of Birth: [birth year]  [facility] CM/SW Contact:  Lennart Pall, LCSW Phone Number: 04/01/2022, 1:35 PM   Clinical Narrative:    Met with pt and wife today and confirming need for rolling walker (already has a 3n1 at home) - no agency preference - order placed with [facility] for delivery to pt's room.  Orders also in for HHPT and no agency preference - referral placed with [facility].  No further TOC needs.   Final next level of care: [place] Barriers to Discharge: No Barriers Identified   Patient Goals and CMS Choice Patient states their goals for this hospitalization and ongoing recovery are:: return home      Discharge Placement                       Discharge Plan and Services                DME Arranged: Walker rolling DME Agency: [employer] Date DME Agency Contacted: 04/01/22 Time DME Agency Contacted: [id number] Representative spoke with at DME Agency: [place]: PT [place]: [place] Date [place]: 04/01/22 Time [place]: [place] Representative spoke with at [place]: Tommi Rumps  Social Determinants of Health ([place]) Interventions     Readmission Risk Interventions     No data to display

## 2022-04-01 NOTE — Anesthesia Procedure Notes (Signed)
Spinal  Patient location during procedure: OB Start time: 04/01/2022 9:35 AM End time: 04/01/2022 9:43 AM Reason for block: surgical anesthesia Staffing Performed: anesthesiologist  Anesthesiologist: Barnet Glasgow, MD Performed by: Barnet Glasgow, MD Authorized by: Barnet Glasgow, MD   Preanesthetic Checklist Completed: patient identified, IV checked, risks and benefits discussed, surgical consent, monitors and equipment checked, pre-op evaluation and timeout performed Spinal Block Patient position: sitting Prep: DuraPrep and site prepped and draped Patient monitoring: heart rate, cardiac monitor, continuous pulse ox and blood pressure Approach: midline Location: L3-4 Injection technique: single-shot Needle Needle type: Pencan  Needle gauge: 24 G Needle length: 10 cm Needle insertion depth: 8 cm Assessment Sensory level: T4 Events: CSF return and second provider Additional Notes  2 Attempt (s). Pt tolerated procedure well.

## 2022-04-01 NOTE — Transfer of Care (Signed)
Immediate Anesthesia Transfer of Care Note  Patient: Victor Ford  Procedure(s) Performed: RIGHT TOTAL HIP ARTHROPLASTY ANTERIOR APPROACH (Right: Hip)  Patient Location: PACU  Anesthesia Type:MAC and Spinal  Level of Consciousness: awake, alert , oriented and patient cooperative  Airway & Oxygen Therapy: Patient Spontanous Breathing and Patient connected to face mask oxygen  Post-op Assessment: Report given to RN and Post -op Vital signs reviewed and stable  Post vital signs: Reviewed and stable  Last Vitals:  Vitals Value Taken Time  BP 108/64 04/01/22 1115  Temp    Pulse 91 04/01/22 1115  Resp 19 04/01/22 1115  SpO2 100 % 04/01/22 1115  Vitals shown include unvalidated device data.  Last Pain:  Vitals:   04/01/22 0745  TempSrc:   PainSc: 0-No pain         Complications: No notable events documented.

## 2022-04-01 NOTE — Evaluation (Signed)
Physical Therapy Evaluation Patient Details Name: Victor Ford MRN: [id number] DOB: 1971/04/11 Today's Date: 04/01/2022  History of Present Illness  Pt is a 51yo male presenting s/p R-THA, AA on 04/01/22. PMH: HLD, HTN, hypothyroidism.  Clinical Impression  Victor Ford is a 51 y.o. male POD 0 s/p R-THA, AA. Patient reports modified independence using SPC for mobility at baseline. Prior to performing bed mobility, took pt's vitals in supine, left arm. Returned 191/122. Adjusted BP cuff, vitals 183/109, HR 101, SpO2 95% on RA. RN notified, further mobility deferred. Patient instructed in exercise to facilitate ROM and circulation to manage edema. Patient will benefit from continued skilled PT interventions to address impairments and progress towards PLOF. Acute PT will follow to progress mobility and stair training in preparation for safe discharge home.        Recommendations for follow up therapy are one component of a multi-disciplinary discharge planning process, led by the attending physician.  Recommendations may be updated based on patient status, additional functional criteria and insurance authorization.  Follow Up Recommendations Follow physician's recommendations for discharge plan and follow up therapies    Assistance Recommended at Discharge Intermittent Supervision/Assistance  Patient can return home with the following  A little help with walking and/or transfers;A little help with bathing/dressing/bathroom;Assistance with cooking/housework;Assist for transportation;Help with stairs or ramp for entrance    Equipment Recommendations None recommended by PT  Recommendations for Other Services       Functional Status Assessment Patient has had a recent decline in their functional status and demonstrates the ability to make significant improvements in function in a reasonable and predictable amount of time.     Precautions / Restrictions Precautions Precautions:  Fall Precaution Comments: Watch BP Restrictions Weight Bearing Restrictions: No Other Position/Activity Restrictions: WBAT      Mobility  Bed Mobility               General bed mobility comments: Prior to performing bed mobility, took pt's vitals in supine, left arm. Returned 191/122. Adjusted BP cuff, vitals 183/109, HR 101, SpO2 95% on RA. RN notified, further mobility deferred.    Transfers                   General transfer comment: deferred    Ambulation/Gait               General Gait Details: deferred  Stairs            Wheelchair Mobility    Modified Rankin (Stroke Patients Only)       Balance                                             Pertinent Vitals/Pain Pain Assessment Pain Assessment: 0-10 Pain Score: 6  Pain Location: right hip Pain Descriptors / Indicators: Operative site guarding, Discomfort Pain Intervention(s): Limited activity within patient's tolerance, Repositioned, Ice applied, Monitored during session    Home Living Family/patient expects to be discharged to:: Private residence Living Arrangements: Spouse/significant other Available Help at Discharge: Family;Available 24 hours/day Type of Home: House Home Access: Stairs to enter Entrance Stairs-Rails: None Entrance Stairs-Number of Steps: 3 Alternate Level Stairs-Number of Steps: 15 Home Layout: Two level;Bed/bath upstairs Home Equipment: BSC/3in1;Rolling Walker (2 wheels);Cane - single point      Prior Function Prior Level of Function : Independent/Modified Independent;Driving;Working/employed (Desk work)  Mobility Comments: SPC all the time for the last couple of weeks ADLs Comments: intermittent help with socks and shoes     Hand Dominance        Extremity/Trunk Assessment   Upper Extremity Assessment Upper Extremity Assessment: Overall WFL for tasks assessed    Lower Extremity Assessment Lower Extremity  Assessment: RLE deficits/detail;LLE deficits/detail RLE Deficits / Details: MMT ank DF/PF 5/5 RLE Sensation: WNL LLE Deficits / Details: MMT ank DF/PF 5/5 LLE Sensation: WNL    Cervical / Trunk Assessment Cervical / Trunk Assessment: Normal  Communication   Communication: No difficulties  Cognition Arousal/Alertness: Awake/alert Behavior During Therapy: WFL for tasks assessed/performed Overall Cognitive Status: Within Functional Limits for tasks assessed                                          General Comments      Exercises Total Joint Exercises Ankle Circles/Pumps: AROM, Both, 10 reps Other Exercises Other Exercises: incentive spirometry x2, pt reached 2093m   Assessment/Plan    PT Assessment Patient needs continued PT services  PT Problem List Decreased strength;Decreased range of motion;Decreased activity tolerance;Decreased balance;Decreased mobility;Decreased coordination;Decreased knowledge of use of DME;Pain;Cardiopulmonary status limiting activity       PT Treatment Interventions DME instruction;Gait training;Stair training;Functional mobility training;Therapeutic activities;Therapeutic exercise;Balance training;Neuromuscular re-education;Patient/family education    PT Goals (Current goals can be found in the Care Plan section)  Acute Rehab PT Goals Patient Stated Goal: walking normal PT Goal Formulation: With patient Time For Goal Achievement: 04/08/22 Potential to Achieve Goals: Good    Frequency 7X/week     Co-evaluation               AM-PAC PT "6 Clicks" Mobility  Outcome Measure Help needed turning from your back to your side while in a flat bed without using bedrails?: A Little Help needed moving from lying on your back to sitting on the side of a flat bed without using bedrails?: A Little Help needed moving to and from a bed to a chair (including a wheelchair)?: A Little Help needed standing up from a chair using your arms  (e.g., wheelchair or bedside chair)?: A Little Help needed to walk in hospital room?: A Little Help needed climbing 3-5 steps with a railing? : A Little 6 Click Score: 18    End of Session   Activity Tolerance: Treatment limited secondary to medical complications (Comment) (BP) Patient left: in bed;with call bell/phone within reach;with bed alarm set;with SCD's reapplied Nurse Communication: Other (comment) (BP) PT Visit Diagnosis: Difficulty in walking, not elsewhere classified (R26.2);Pain Pain - Right/Left: Right Pain - part of body: Hip    Time: 11914-7829PT Time Calculation (min) (ACUTE ONLY): 22 min   Charges:   PT Evaluation $PT Eval Low Complexity: 1 Low          WCoolidge Breeze PT, DPT [facility] Department Office: [phone]: [phone] WJervis Trapani6/06/2022, 5:59 PM

## 2022-04-01 NOTE — Anesthesia Postprocedure Evaluation (Signed)
Anesthesia Post Note  Patient: Victor Ford  Procedure(s) Performed: RIGHT TOTAL HIP ARTHROPLASTY ANTERIOR APPROACH (Right: Hip)     Patient location during evaluation: Nursing Unit Anesthesia Type: Spinal Level of consciousness: oriented and awake and alert Pain management: pain level controlled Vital Signs Assessment: post-procedure vital signs reviewed and stable Respiratory status: spontaneous breathing and respiratory function stable Cardiovascular status: blood pressure returned to baseline and stable Postop Assessment: no headache, no backache, no apparent nausea or vomiting and patient able to bend at knees Anesthetic complications: no   No notable events documented.  Last Vitals:  Vitals:   04/01/22 1215 04/01/22 1246  BP: (!) 148/99 (!) 158/98  Pulse: 86 67  Resp: 19 18  Temp: 36.8 C 36.7 C  SpO2: 100% 98%    Last Pain:  Vitals:   04/01/22 1246  TempSrc: Oral  PainSc: 2                  Barnet Glasgow

## 2022-04-01 NOTE — Progress Notes (Signed)
Patients blood pressure continues to be elevated 156/100, 182/109. Call out to physician oncall [phone]. Awaiting response. Patient currently comfortable denies any chest or head pain.

## 2022-04-01 NOTE — Brief Op Note (Signed)
04/01/2022  10:55 AM  PATIENT:  Desma Paganini  51 y.o. male  PRE-OPERATIVE DIAGNOSIS:  OSTEOARTHRITIS RIGHT HIP  POST-OPERATIVE DIAGNOSIS:  OSTEOARTHRITIS RIGHT HIP  PROCEDURE:  Procedure(s): RIGHT TOTAL HIP ARTHROPLASTY ANTERIOR APPROACH (Right)  SURGEON:  Surgeon(s) and Role:    Mcarthur Rossetti, MD - Primary  PHYSICIAN ASSISTANT:  Benita Stabile, PA-C  ANESTHESIA:   spinal  EBL:  150 mL   COUNTS:  YES  DICTATION: .Other Dictation: Dictation Number [id number]  PLAN OF CARE: Admit for overnight observation  PATIENT DISPOSITION:  PACU - hemodynamically stable.   Delay start of Pharmacological VTE agent (>24hrs) due to surgical blood loss or risk of bleeding: no

## 2022-04-01 NOTE — Anesthesia Procedure Notes (Signed)
Procedure Name: MAC Date/Time: 04/01/2022 9:33 AM  Performed by: West Pugh, CRNAPre-anesthesia Checklist: Patient identified, Emergency Drugs available, Suction available, Patient being monitored and Timeout performed Patient Re-evaluated:Patient Re-evaluated prior to induction Oxygen Delivery Method: Simple face mask Preoxygenation: Pre-oxygenation with 100% oxygen Induction Type: IV induction Placement Confirmation: positive ETCO2 Dental Injury: Teeth and Oropharynx as per pre-operative assessment

## 2022-04-01 NOTE — Plan of Care (Signed)

## 2022-04-02 DIAGNOSIS — M1611 Unilateral primary osteoarthritis, right hip: Secondary | ICD-10-CM | POA: Diagnosis not present

## 2022-04-02 LAB — BASIC METABOLIC PANEL
Anion gap: 5 (ref 5–15)
BUN: 19 mg/dL (ref 6–20)
CO2: 29 mmol/L (ref 22–32)
Calcium: 8.9 mg/dL (ref 8.9–10.3)
Chloride: 108 mmol/L (ref 98–111)
Creatinine, Ser: 0.79 mg/dL (ref 0.61–1.24)
GFR, Estimated: >60 mL/min (ref >60)
Glucose, Bld: 147 mg/dL — ABNORMAL HIGH (ref 70–99)
Potassium: 4.2 mmol/L (ref 3.5–5.1)
Sodium: 142 mmol/L (ref 135–145)

## 2022-04-02 LAB — CBC
HCT: 39 % (ref 39.0–52.0)
Hemoglobin: 13.2 g/dL (ref 13.0–17.0)
MCH: 30.1 pg (ref 26.0–34.0)
MCHC: 33.8 g/dL (ref 30.0–36.0)
MCV: 89 fL (ref 80.0–100.0)
Platelets: 212 10*3/uL (ref 150–400)
RBC: 4.38 MIL/uL (ref 4.22–5.81)
RDW: 13 % (ref 11.5–15.5)
WBC: 12.3 10*3/uL — ABNORMAL HIGH (ref 4.0–10.5)
nRBC: 0 % (ref 0.0–0.2)

## 2022-04-02 MED ORDER — OXYCODONE HCL 5 MG PO TABS: 5.0000 mg | ORAL_TABLET | ORAL | 0 refills | Status: DC | PRN

## 2022-04-02 MED ORDER — CLONIDINE HCL 0.1 MG PO TABS
0.1000 mg | ORAL_TABLET | Freq: Three times a day (TID) | ORAL | Status: DC | PRN
  Administered 2022-04-02: 0.1 mg via ORAL
  Filled 2022-04-02: qty 1

## 2022-04-02 MED ORDER — ASPIRIN 81 MG PO CHEW: 81.0000 mg | CHEWABLE_TABLET | Freq: Two times a day (BID) | ORAL | 0 refills | Status: DC

## 2022-04-02 MED ORDER — METHOCARBAMOL 500 MG PO TABS: 500.0000 mg | ORAL_TABLET | Freq: Four times a day (QID) | ORAL | 1 refills | Status: DC | PRN

## 2022-04-02 NOTE — Progress Notes (Signed)
Subjective: 1 Day Post-Op Procedure(s) (LRB): RIGHT TOTAL HIP ARTHROPLASTY ANTERIOR APPROACH (Right) Patient reports pain as moderate.  He was hypertensive quite a bit last evening so a medicine consult was called and I do appreciate [facility] consulting on the patient.  He is on medications as relates to his blood pressure.  It is felt that his pressures running a little bit more high due to pain from the surgery.  He has not been up with therapy and does feel stiff.  He does feel that he could potentially go home later this afternoon if he mobilizes well with therapy.  His labs look great.  Objective: Vital signs in last 24 hours: Temp:  [97.7 F (36.5 C)-98.7 F (37.1 C)] 97.7 F (36.5 C) (06/10 0919) Pulse Rate:  [67-95] 90 (06/10 0919) Resp:  [10-20] 15 (06/10 0919) BP: (108-179)/(64-107) 162/93 (06/10 0919) SpO2:  [94 %-100 %] 99 % (06/10 0919) Weight:  [116.5 kg] 116.5 kg (06/09 1252)  Intake/Output from previous day: 06/09 0701 - 06/10 0700 In: 3046.7 [P.O.:320; I.V.:2356.8; IV Piggyback:369.9] Out: 2650 [Urine:2500; Blood:150] Intake/Output this shift: No intake/output data recorded.  Recent Labs    04/02/22 0318  HGB 13.2   Recent Labs    04/02/22 0318  WBC 12.3*  RBC 4.38  HCT 39.0  PLT 212   Recent Labs    04/02/22 0318  NA 142  K 4.2  CL 108  CO2 29  BUN 19  CREATININE 0.79  GLUCOSE 147*  CALCIUM 8.9   No results for input(s): "LABPT", "INR" in the last 72 hours.  Sensation intact distally Intact pulses distally Dorsiflexion/Plantar flexion intact Incision: dressing C/D/I   Assessment/Plan: 1 Day Post-Op Procedure(s) (LRB): RIGHT TOTAL HIP ARTHROPLASTY ANTERIOR APPROACH (Right) Up with therapy Discharge home with home health this afternoon if clears therapy.      Mcarthur Rossetti 04/02/2022, 9:40 AM

## 2022-04-02 NOTE — Plan of Care (Signed)
  Problem: Education: Goal: Knowledge of General Education information will improve Description: Including pain rating scale, medication(s)/side effects and non-pharmacologic comfort measures Outcome: Progressing   Problem: Activity: Goal: Risk for activity intolerance will decrease Outcome: Progressing   Problem: Pain Managment: Goal: General experience of comfort will improve Outcome: Progressing   

## 2022-04-02 NOTE — Discharge Summary (Signed)
Patient ID: Victor Ford MRN: [id number] DOB/AGE: 1971/08/21 51 y.o.  Admit date: 04/01/2022 Discharge date: 04/02/2022  Admission Diagnoses:  Principal Problem:   Unilateral primary osteoarthritis, right hip Active Problems:   Status post total replacement of right hip   Discharge Diagnoses:  Same  Past Medical History:  Diagnosis Date   Allergy    Hyperlipidemia    diet controlled/no meds   Hypertension    Hypothyroidism     Surgeries: Procedure(s): RIGHT TOTAL HIP ARTHROPLASTY ANTERIOR APPROACH on 04/01/2022   Consultants:   Discharged Condition: Improved  Hospital Course: Victor Ford is an 51 y.o. male who was admitted 04/01/2022 for operative treatment ofUnilateral primary osteoarthritis, right hip. Patient has severe unremitting pain that affects sleep, daily activities, and work/hobbies. After pre-op clearance the patient was taken to the operating room on 04/01/2022 and underwent  Procedure(s): RIGHT TOTAL HIP ARTHROPLASTY ANTERIOR APPROACH.    Patient was given perioperative antibiotics:  Anti-infectives (From admission, onward)    Start     Dose/Rate Route Frequency Ordered Stop   04/01/22 1600  ceFAZolin (ANCEF) IVPB 1 g/50 mL premix        1 g 100 mL/hr over 30 Minutes Intravenous Every 6 hours 04/01/22 1242 04/01/22 2207   04/01/22 0730  ceFAZolin (ANCEF) IVPB 2g/100 mL premix        2 g 200 mL/hr over 30 Minutes Intravenous On call to O.R. 04/01/22 9326 04/01/22 1014        Patient was given sequential compression devices, early ambulation, and chemoprophylaxis to prevent DVT.  Patient benefited maximally from hospital stay and there were no complications.    Recent vital signs: Patient Vitals for the past 24 hrs:  BP Temp Temp src Pulse Resp SpO2  04/02/22 0919 (!) 162/93 97.7 F (36.5 C) Oral 90 15 99 %  04/02/22 0804 (!) 158/107 -- -- 78 -- --  04/02/22 0553 (!) 179/102 98.3 F (36.8 C) Oral 78 16 100 %  04/02/22 0149 (!) 152/96 98.1 F  (36.7 C) Oral 80 20 95 %  04/01/22 2151 (!) 174/97 98 F (36.7 C) Oral 88 20 94 %     Recent laboratory studies:  Recent Labs    04/02/22 0318  WBC 12.3*  HGB 13.2  HCT 39.0  PLT 212  NA 142  K 4.2  CL 108  CO2 29  BUN 19  CREATININE 0.79  GLUCOSE 147*  CALCIUM 8.9     Discharge Medications:   Allergies as of 04/02/2022   No Known Allergies      Medication List     STOP taking these medications    traMADol 50 MG tablet Commonly known as: ULTRAM       TAKE these medications    acetaminophen 500 MG tablet Commonly known as: TYLENOL Take 1,000 mg by mouth in the morning and at bedtime.   aspirin 81 MG chewable tablet Chew 1 tablet (81 mg total) by mouth 2 (two) times daily.   B-12 PO Take 1 tablet by mouth daily.   cetirizine 10 MG tablet Commonly known as: ZYRTEC Take 10 mg by mouth daily as needed for allergies.   levothyroxine 175 MCG tablet Commonly known as: SYNTHROID TAKE 1 TABLET BY MOUTH DAILY BEFORE BREAKFAST.   losartan-hydrochlorothiazide 50-12.5 MG tablet Commonly known as: HYZAAR TAKE 1 TABLET BY MOUTH EVERY DAY   methocarbamol 500 MG tablet Commonly known as: ROBAXIN Take 1 tablet (500 mg total) by mouth every 6 (six) hours  as needed for muscle spasms.   oxyCODONE 5 MG immediate release tablet Commonly known as: Oxy IR/ROXICODONE Take 1-2 tablets (5-10 mg total) by mouth every 4 (four) hours as needed for moderate pain (pain score 4-6).   tadalafil 20 MG tablet Commonly known as: CIALIS Take 0.5-1 tablets (10-20 mg total) by mouth every other day as needed for erectile dysfunction.   trolamine salicylate 10 % cream Commonly known as: ASPERCREME Apply 1 application. topically as needed for muscle pain.   VITAMIN C PO Take 1 tablet by mouth daily.        Diagnostic Studies: DG Pelvis Portable  Result Date: 04/01/2022 CLINICAL DATA:  Status post right hip arthroplasty. EXAM: PORTABLE PELVIS 1-2 VIEWS COMPARISON:  None  Available. FINDINGS: Postsurgical change of right hip arthroplasty without evidence of acute hardware complication. Severe left hip degenerative change. IMPRESSION: Postsurgical change of right hip arthroplasty without evidence of acute hardware complication. Electronically Signed   By: Dahlia Bailiff M.D.   On: 04/01/2022 11:39   DG HIP UNILAT WITH PELVIS 1V RIGHT  Result Date: 04/01/2022 CLINICAL DATA:  Fluoroscopic assistance for right hip arthroplasty EXAM: DG HIP (WITH OR WITHOUT PELVIS) 1V RIGHT COMPARISON:  01/31/2022 FINDINGS: Fluoroscopic images show right hip arthroplasty. Estimated radiation dose is 3.18 mGy. IMPRESSION: Fluoroscopic assistance was provided for right hip arthroplasty. Electronically Signed   By: Elmer Picker M.D.   On: 04/01/2022 11:30   DG C-Arm 1-60 Min-No Report  Result Date: 04/01/2022 Fluoroscopy was utilized by the requesting physician.  No radiographic interpretation.    Disposition: Discharge disposition: 01-Home or Self Care          Follow-up Information     [facility] Follow up.   Specialty: [place] Why: to provide home physical therapy Contact information: [address] [place] [address] [phone]         Mcarthur Rossetti, MD Follow up in 2 week(s).   Specialty: Orthopedic Surgery Contact information: [address] [place] [address] [phone]                  Signed: Mcarthur Rossetti 04/02/2022, 8:16 PM

## 2022-04-02 NOTE — Progress Notes (Signed)
Pt alert and oriented. Surgical site clean dry and intact. No queries regarding discharge instructions. Belongings sent home with  pt.

## 2022-04-02 NOTE — Progress Notes (Signed)
04/02/22 1400  PT Visit Information  Last PT Received On 04/02/22  Assistance Needed +1 Meeting goals. Ready for d/c with family assist as needed from PT standpoint.  History of Present Illness Pt is a 51yo male presenting s/p R-THA, AA on 04/01/22. PMH: HLD, HTN, hypothyroidism.  Subjective Data  Patient Stated Goal walking normal  Precautions  Precautions Fall  Precaution Comments Watch BP  Restrictions  Weight Bearing Restrictions No  Other Position/Activity Restrictions WBAT  Pain Assessment  Pain Assessment 0-10  Pain Score 4  Pain Location right hip  Pain Descriptors / Indicators Operative site guarding;Discomfort;Sore;Tightness  Pain Intervention(s) Limited activity within patient's tolerance;Monitored during session;Premedicated before session;Repositioned  Cognition  Arousal/Alertness Awake/alert  Behavior During Therapy WFL for tasks assessed/performed  Overall Cognitive Status Within Functional Limits for tasks assessed  Bed Mobility  General bed mobility comments in recliner  Transfers  Overall transfer level Needs assistance  Equipment used Rolling walker (2 wheels)  Transfers Sit to/from Stand  Sit to Stand Supervision;Modified independent (Device/Increase time)  General transfer comment self cues  Ambulation/Gait  Ambulation/Gait assistance Supervision  Gait Distance (Feet) 190 Feet (x2)  Assistive device Rolling walker (2 wheels)  Gait Pattern/deviations Step-through pattern  General Gait Details deferred  Stairs Yes  Stairs assistance Min guard;Supervision  Stair Management Step to pattern;One rail Right;One rail Left;With cane;Forwards  Number of Stairs 7 (x2)  General stair comments using technique pt has been practicing at home, amb up/down 7 steps x2 with cane and RW. supervision for safety. pt wife educated on walking behind pt when ascending stairs for safety  Exercises  Exercises Total Joint;Other exercises (verbally reviewed/initiated HEP,  defer to HHPT)  PT - End of Session  Equipment Utilized During Treatment Gait belt  Activity Tolerance Patient tolerated treatment well  Patient left with call bell/phone within reach;in chair;with family/visitor present;with chair alarm set   PT - Assessment/Plan  PT Plan Current plan remains appropriate  PT Visit Diagnosis Difficulty in walking, not elsewhere classified (R26.2);Pain  Pain - Right/Left Right  Pain - part of body Hip  PT Frequency (ACUTE ONLY) 7X/week  Follow Up Recommendations Follow physician's recommendations for discharge plan and follow up therapies  Assistance recommended at discharge Intermittent Supervision/Assistance  Patient can return home with the following A little help with walking and/or transfers;A little help with bathing/dressing/bathroom;Assistance with cooking/housework;Assist for transportation;Help with stairs or ramp for entrance  PT equipment None recommended by PT  AM-PAC PT "6 Clicks" Mobility Outcome Measure (Version 2)  Help needed turning from your back to your side while in a flat bed without using bedrails? 3  Help needed moving from lying on your back to sitting on the side of a flat bed without using bedrails? 3  Help needed moving to and from a bed to a chair (including a wheelchair)? 4  Help needed standing up from a chair using your arms (e.g., wheelchair or bedside chair)? 4  Help needed to walk in hospital room? 3  Help needed climbing 3-5 steps with a railing?  3  6 Click Score 20  Consider Recommendation of Discharge To: Home with no services  Progressive Mobility  What is the highest level of mobility based on the progressive mobility assessment? Level 5 (Walks with assist in room/hall) - Balance while stepping forward/back and can walk in room with assist - Complete  Activity Ambulated independently in room;Ambulated with assistance in hallway  PT Goal Progression  Progress towards PT goals Progressing toward goals  Acute Rehab  PT Goals  PT Goal Formulation With patient  Time For Goal Achievement 04/08/22  Potential to Achieve Goals Good  PT Time Calculation  PT Start Time (ACUTE ONLY) 1400  PT Stop Time (ACUTE ONLY) 1420  PT Time Calculation (min) (ACUTE ONLY) 20 min  PT General Charges  $$ ACUTE PT VISIT 1 Visit  PT Treatments  $Gait Training 8-22 mins

## 2022-04-02 NOTE — Progress Notes (Signed)
Physical Therapy Treatment Patient Details Name: Victor Ford MRN: [id number] DOB: Mar 12, 1971 Today's Date: 04/02/2022   History of Present Illness Pt is a 51yo male presenting s/p R-THA, AA on 04/01/22. PMH: HLD, HTN, hypothyroidism.    PT Comments    Pt is progressing toward goals. Feeling better and pleased to be OOB.  Last BP 162/93. No symptoms of incr BP with mobility. Will review stairs this pm and anticipate d/c as long as BP remains controlled.   Recommendations for follow up therapy are one component of a multi-disciplinary discharge planning process, led by the attending physician.  Recommendations may be updated based on patient status, additional functional criteria and insurance authorization.  Follow Up Recommendations  Follow physician's recommendations for discharge plan and follow up therapies     Assistance Recommended at Discharge Intermittent Supervision/Assistance  Patient can return home with the following A little help with walking and/or transfers;A little help with bathing/dressing/bathroom;Assistance with cooking/housework;Assist for transportation;Help with stairs or ramp for entrance   Equipment Recommendations  None recommended by PT    Recommendations for Other Services       Precautions / Restrictions Precautions Precautions: Fall Precaution Comments: Watch BP Restrictions Weight Bearing Restrictions: No Other Position/Activity Restrictions: WBAT     Mobility  Bed Mobility Overal bed mobility: Needs Assistance Bed Mobility: Supine to Sit     Supine to sit: Supervision, HOB elevated     General bed mobility comments: for safety, able to use gait belt as leg lifter to progress RLE off bed    Transfers Overall transfer level: Needs assistance Equipment used: Rolling walker (2 wheels) Transfers: Sit to/from Stand Sit to Stand: Min guard           General transfer comment: cues for hand placement, and RLE position     Ambulation/Gait Ambulation/Gait assistance: Supervision Gait Distance (Feet): 280 Feet Assistive device: Rolling walker (2 wheels) Gait Pattern/deviations: Step-through pattern       General Gait Details: deferred   Stairs             Wheelchair Mobility    Modified Rankin (Stroke Patients Only)       Balance                                            Cognition Arousal/Alertness: Awake/alert Behavior During Therapy: WFL for tasks assessed/performed Overall Cognitive Status: Within Functional Limits for tasks assessed                                          Exercises Total Joint Exercises Ankle Circles/Pumps: AROM, Both, 10 reps Quad Sets: AROM, Both, 5 reps    General Comments        Pertinent Vitals/Pain Pain Assessment Pain Assessment: 0-10 Pain Score: 5  Pain Location: right hip Pain Descriptors / Indicators: Operative site guarding, Discomfort, Sore, Tightness Pain Intervention(s): Limited activity within patient's tolerance, Monitored during session, Repositioned, Premedicated before session    Home Living                          Prior Function            PT Goals (current goals can now be found in the care plan section) Acute  Rehab PT Goals Patient Stated Goal: walking normal PT Goal Formulation: With patient Time For Goal Achievement: 04/08/22 Potential to Achieve Goals: Good Progress towards PT goals: Progressing toward goals    Frequency    7X/week      PT Plan Current plan remains appropriate    Co-evaluation              AM-PAC PT "6 Clicks" Mobility   Outcome Measure  Help needed turning from your back to your side while in a flat bed without using bedrails?: A Little Help needed moving from lying on your back to sitting on the side of a flat bed without using bedrails?: A Little Help needed moving to and from a bed to a chair (including a wheelchair)?: A  Little Help needed standing up from a chair using your arms (e.g., wheelchair or bedside chair)?: A Little Help needed to walk in hospital room?: A Little Help needed climbing 3-5 steps with a railing? : A Little 6 Click Score: 18    End of Session Equipment Utilized During Treatment: Gait belt Activity Tolerance: Patient tolerated treatment well (BP) Patient left: with call bell/phone within reach;in chair;with family/visitor present;with chair alarm set   PT Visit Diagnosis: Difficulty in walking, not elsewhere classified (R26.2);Pain Pain - Right/Left: Right Pain - part of body: Hip     Time: 0737-1062 PT Time Calculation (min) (ACUTE ONLY): 28 min  Charges:  $Gait Training: 23-37 mins                     Baxter Flattery, PT  Acute Rehab Dept ([place]) [phone] Pager [phone]  04/02/2022    [facility] 04/02/2022, 11:28 AM

## 2022-04-02 NOTE — Discharge Instructions (Signed)

## 2022-04-04 ENCOUNTER — Encounter (HOSPITAL_COMMUNITY): Payer: Self-pay | Admitting: Orthopaedic Surgery

## 2022-04-04 ENCOUNTER — Telehealth: Payer: Self-pay

## 2022-04-04 NOTE — Telephone Encounter (Signed)
Transition Care Management Unsuccessful Follow-up Telephone Call  Date of discharge and from where:  [facility] 04/01/22-04/02/22  Attempts:  1st Attempt  Reason for unsuccessful TCM follow-up call:  Unable to leave message  Johnney Killian, RN, BSN, CCM Care Management Coordinator [facility] Phone: [phone]: [phone]

## 2022-04-05 ENCOUNTER — Telehealth: Payer: Self-pay

## 2022-04-05 NOTE — Telephone Encounter (Signed)
Spoke to pt. He said he is walking around. Able to walk up and down steps. PT is coming tomorrow. Says he thinks he is doing well.

## 2022-04-05 NOTE — Telephone Encounter (Signed)
-----   Message from Venia Carbon, MD sent at 04/03/2022 10:52 AM EDT ----- Please check on him after his total hip ----- Message ----- From: Mcarthur Rossetti, MD Sent: 04/01/2022   3:22 PM EDT To: Mcarthur Rossetti, MD; #

## 2022-04-07 ENCOUNTER — Telehealth: Payer: Self-pay | Admitting: Orthopaedic Surgery

## 2022-04-07 NOTE — Telephone Encounter (Signed)
Is requesting the following verbal PT orders for the patient:  1x a week for 1 week 2x a week for 2 weeks  1x a week for 2 weeks (prn)  Please advise

## 2022-04-07 NOTE — Telephone Encounter (Signed)
Verbal order given  

## 2022-04-14 ENCOUNTER — Encounter: Payer: Self-pay | Admitting: Orthopaedic Surgery

## 2022-04-14 ENCOUNTER — Ambulatory Visit (INDEPENDENT_AMBULATORY_CARE_PROVIDER_SITE_OTHER): Payer: 59 | Admitting: Orthopaedic Surgery

## 2022-04-14 DIAGNOSIS — Z96641 Presence of right artificial hip joint: Secondary | ICD-10-CM

## 2022-04-14 NOTE — Progress Notes (Signed)
This is a first visit for Victor Ford at 2 weeks status post a right total hip arthroplasty.  He is only 51 years old.  He also has significant arthritis in his left hip.  He has made great progress with physical therapy.  He is ambulate with a cane.  He has been compliant with a baby aspirin twice a day.  He is off of pain medication as well.  He has practiced driving a little bit as well.  His right hip incision looks great.  The staples been removed and Steri-Strips applied.  Overall he does look like he is doing very well.  I will see him back in 4 weeks to see how is doing overall.  I will allow him to return to full work duty starting July 5.  At some point we will discuss replacing his left hip.  No x-rays are needed at his next visit.

## 2022-05-12 ENCOUNTER — Encounter: Payer: Self-pay | Admitting: Orthopaedic Surgery

## 2022-05-12 ENCOUNTER — Ambulatory Visit (INDEPENDENT_AMBULATORY_CARE_PROVIDER_SITE_OTHER): Payer: 59 | Admitting: Orthopaedic Surgery

## 2022-05-12 DIAGNOSIS — Z96641 Presence of right artificial hip joint: Secondary | ICD-10-CM

## 2022-05-12 NOTE — Progress Notes (Signed)
Victor Ford comes in today at 6 weeks status post a right total hip arthroplasty.  He has known severe arthritis of his left hip and he is thinking about a left hip replacement in November as he recovers from the right hip.  He is walking without assistive device and back to work and doing well overall.  His right operative hip moves smoothly and fluidly.  His left hip has significant stiffness with internal and external rotation.  He will continue to increase his activities as comfort allows.  We will see him back in 2 months and I would like a standing low AP pelvis and lateral of his more recent operative hip at that visit.  We can then work on scheduling him for his left hip replacement in November.

## 2022-07-07 ENCOUNTER — Ambulatory Visit (INDEPENDENT_AMBULATORY_CARE_PROVIDER_SITE_OTHER): Payer: 59

## 2022-07-07 ENCOUNTER — Encounter: Payer: Self-pay | Admitting: Orthopaedic Surgery

## 2022-07-07 ENCOUNTER — Ambulatory Visit (INDEPENDENT_AMBULATORY_CARE_PROVIDER_SITE_OTHER): Payer: 59 | Admitting: Orthopaedic Surgery

## 2022-07-07 DIAGNOSIS — Z96641 Presence of right artificial hip joint: Secondary | ICD-10-CM | POA: Diagnosis not present

## 2022-07-07 NOTE — Progress Notes (Signed)
The patient is now just past 3 months status post a right total hip arthroplasty.  He is doing very well with his right hip.  He reports improved range of motion and strength.  He has known and well-documented severe end-stage arthritis of the left hip.  He is thinking about having that hip replaced and I agree based on he still walks with a significant limp and Trendelenburg gait as a relates to his left hip.  On exam, his right operative hip moves smoothly and fluidly.  His pain is minimal with the right hip.  His left hip has severe pain with any attempts of internal and external rotation and there is significant loss in rotation as well.  An AP pelvis and lateral of the right hip shows a well-seated total hip arthroplasty on the right side.  The left hip has complete loss of joint space with flattening of the femoral head.  There are cystic changes in the femoral head and acetabulum as well as osteophytes.  This is severe bone-on-bone arthritis.  He is thinking about the timing for a left hip replacement.  He unfortunately lost his job earlier this week and is dealing with issues as a relates to that.  He has our surgery scheduler's card and he will let us know how to proceed.  At this point I do feel comfortable with proceeding with a left hip replacement given his clinical exam and x-ray findings.

## 2022-07-21 ENCOUNTER — Other Ambulatory Visit: Payer: Self-pay | Admitting: Physician Assistant

## 2022-07-23 NOTE — Progress Notes (Signed)
COVID Vaccine received:  '[]'$  No '[x]'$  Yes Date of any COVID positive Test in last 47 days:None  PCP - Viviana Simpler, MD Cardiologist - none  Chest x-ray - n/a EKG -  03-23-2022  Epic Stress Test - n/a ECHO - n/a Cardiac Cath - n/a  Pacemaker/ICD device     '[x]'$  N/A Spinal Cord Stimulator:'[x]'$  No '[]'$  Yes      (Remind patient to bring remote DOS) Other Implants:   History of Sleep Apnea? '[x]'$  No '[]'$  Yes   Sleep Study Date:   CPAP used?- '[x]'$  No '[]'$  Yes  (Instruct to bring their mask & Tubing)  Does the patient monitor blood sugar? '[]'$  No '[]'$  Yes  '[x]'$  N/A  Blood Thinner Instructions: None Aspirin Instructions: none Last Dose:  ERAS Protocol Ordered: '[]'$  No  '[x]'$  Yes PRE-SURGERY '[x]'$  ENSURE  '[]'$  G2   Comments: patient has a scratched right cornea from sand; he takes eye drops. He will go for re-evaluation soon but has problems with vision in that eye (cloudy).   Activity level: Patient can climb a flight of stairs without difficulty;  '[x]'$  No CP  '[x]'$  No SOB,  but would have __leg pain.  He can perform all of his ADLs.    Anesthesia review: HTN  Patient denies shortness of breath, fever, cough and chest pain at PAT appointment.  Patient verbalized understanding and agreement to the Pre-Surgical Instructions that were given to them at this PAT appointment. Patient was also educated of the need to review these PAT instructions again prior to his/her surgery.I reviewed the appropriate phone numbers to call if they have any and questions or concerns.

## 2022-07-23 NOTE — Patient Instructions (Signed)
SURGICAL WAITING ROOM VISITATION Patients having surgery or a procedure may have no more than 2 support people in the waiting area - these visitors may rotate in the visitor waiting room.   Children under the age of 39 must have an adult with them who is not the patient. If the patient needs to stay at the hospital during part of their recovery, the visitor guidelines for inpatient rooms apply.  PRE-OP VISITATION  Pre-op nurse will coordinate an appropriate time for 1 support person to accompany the patient in pre-op.  This support person may not rotate.  This visitor will be contacted when the time is appropriate for the visitor to come back in the pre-op area.  Please refer to the [facility] website for the visitor guidelines for Inpatients (after your surgery is over and you are in a regular room).  You are not required to quarantine at this time prior to your surgery. However, you must do this: Hand Hygiene often Do NOT share personal items Notify your provider if you are in close contact with someone who has COVID or you develop fever 100.4 or greater, new onset of sneezing, cough, sore throat, shortness of breath or body aches.   If you received a COVID test during your pre-op visit  it is requested that you wear a mask when out in public, stay away from anyone that may not be feeling well and notify your surgeon if you develop symptoms. If you test positive for Covid or have been in contact with anyone that has tested positive in the last 10 days please notify you surgeon.       Your procedure is scheduled on:  Friday  August 05, 2022  Report to [facility] Main Entrance.  Report to admitting at: 09:00  AM  +++++Call this number if you have any questions or problems the morning of surgery [phone]  Do not eat food :After Midnight the night prior to your surgery/procedure.  After Midnight you may have the following liquids until   08:30 AM DAY OF SURGERY  Clear  Liquid Diet Water Black Coffee (sugar ok, NO MILK/CREAM OR CREAMERS)  Tea (sugar ok, NO MILK/CREAM OR CREAMERS) regular and decaf                             Plain Jell-O  with no fruit (NO RED)                                           Fruit ices (not with fruit pulp, NO RED)                                     Popsicles (NO RED)                                                                  Juice: apple, WHITE grape, WHITE cranberry Sports drinks like Gatorade or Powerade (NO RED)  The day of surgery:  Drink ONE (1) Pre-Surgery Clear Ensure at   08:30 AM the morning of surgery. Drink in one sitting. Do not sip.  This drink was given to you during your hospital pre-op appointment visit. Nothing else to drink after completing the Pre-Surgery Clear Ensure : No candy, chewing gum or throat lozenges.    FOLLOW ANY ADDITIONAL PRE OP INSTRUCTIONS YOU RECEIVED FROM YOUR SURGEON'S OFFICE!!!   Oral Hygiene is also important to reduce your risk of infection.        Remember - BRUSH YOUR TEETH THE MORNING OF SURGERY WITH YOUR REGULAR TOOTHPASTE  Do NOT smoke after Midnight the night before surgery.  Take ONLY these medicines the morning of surgery with A SIP OF WATER: levothyroxine (Synthroid) and you may take Tylenol if needed. You may use your daily Eye drops.   If You have been diagnosed with Sleep Apnea - Bring CPAP mask and tubing day of surgery. We will provide you with a CPAP machine on the day of your surgery.                   You may not have any metal on your body including jewelry, and body piercing  Do not wear lotions, powders, cologne, or deodorant  Men may shave face and neck.  Contacts, Hearing Aids, dentures or bridgework may not be worn into surgery.   You may bring a small overnight bag with you on the day of surgery, only pack items that are not valuable .[facility] IS NOT RESPONSIBLE   FOR VALUABLES THAT ARE LOST OR STOLEN.   DO NOT [place]. PHARMACY WILL DISPENSE MEDICATIONS LISTED ON YOUR MEDICATION LIST TO YOU DURING YOUR ADMISSION [place]!   Special Instructions: Bring a copy of your healthcare power of attorney and living will documents the day of surgery, if you wish to have them scanned into your [facility] Records- EPIC  Please read over the following fact sheets you were given: IF YOU HAVE QUESTIONS ABOUT YOUR PRE-OP INSTRUCTIONS, PLEASE CALL [phone]  ([place])   [facility] - Preparing for Surgery Before surgery, you can play an important role.  Because skin is not sterile, your skin needs to be as free of germs as possible.  You can reduce the number of germs on your skin by washing with CHG (chlorahexidine gluconate) soap before surgery.  CHG is an antiseptic cleaner which kills germs and bonds with the skin to continue killing germs even after washing. Please DO NOT use if you have an allergy to CHG or antibacterial soaps.  If your skin becomes reddened/irritated stop using the CHG and inform your nurse when you arrive at Short Stay. Do not shave (including legs and underarms) for at least 48 hours prior to the first CHG shower.  You may shave your face/neck.  Please follow these instructions carefully:  1.  Shower with CHG Soap the night before surgery and the  morning of surgery.  2.  If you choose to wash your hair, wash your hair first as usual with your normal  shampoo.  3.  After you shampoo, rinse your hair and body thoroughly to remove the shampoo.                             4.  Use CHG as you would any other liquid soap.  You can apply chg  directly to the skin and wash.  Gently with a scrungie or clean washcloth.  5.  Apply the CHG Soap to your body ONLY FROM THE NECK DOWN.   Do not use on face/ open                           Wound or open sores. Avoid contact with eyes, ears mouth and genitals (private parts).                       Wash face,  Genitals  (private parts) with your normal soap.             6.  Wash thoroughly, paying special attention to the area where your  surgery  will be performed.  7.  Thoroughly rinse your body with warm water from the neck down.  8.  DO NOT shower/wash with your normal soap after using and rinsing off the CHG Soap.            9.  Pat yourself dry with a clean towel.            10.  Wear clean pajamas.            11.  Place clean sheets on your bed the night of your first shower and do not  sleep with pets.  ON THE DAY OF SURGERY : Do not apply any lotions/deodorants the morning of surgery.  Please wear clean clothes to the hospital/surgery center.    FAILURE TO FOLLOW THESE INSTRUCTIONS MAY RESULT IN THE CANCELLATION OF YOUR SURGERY  PATIENT SIGNATURE_________________________________  NURSE SIGNATURE__________________________________  ________________________________________________________________________   Victor Ford    An incentive spirometer is a tool that can help keep your lungs clear and active. This tool measures how well you are filling your lungs with each breath. Taking long deep breaths may help reverse or decrease the chance of developing breathing (pulmonary) problems (especially infection) following: A long period of time when you are unable to move or be active. BEFORE THE PROCEDURE  If the spirometer includes an indicator to show your best effort, your nurse or respiratory therapist will set it to a desired goal. If possible, sit up straight or lean slightly forward. Try not to slouch. Hold the incentive spirometer in an upright position. INSTRUCTIONS FOR USE  Sit on the edge of your bed if possible, or sit up as far as you can in bed or on a chair. Hold the incentive spirometer in an upright position. Breathe out normally. Place the mouthpiece in your mouth and seal your lips tightly around it. Breathe in slowly and as deeply as possible, raising the piston or the  ball toward the top of the column. Hold your breath for 3-5 seconds or for as long as possible. Allow the piston or ball to fall to the bottom of the column. Remove the mouthpiece from your mouth and breathe out normally. Rest for a few seconds and repeat Steps 1 through 7 at least 10 times every 1-2 hours when you are awake. Take your time and take a few normal breaths between deep breaths. The spirometer may include an indicator to show your best effort. Use the indicator as a goal to work toward during each repetition. After each set of 10 deep breaths, practice coughing to be sure your lungs are clear. If you have an incision (the cut made at the time of surgery), support your  incision when coughing by placing a pillow or rolled up towels firmly against it. Once you are able to get out of bed, walk around indoors and cough well. You may stop using the incentive spirometer when instructed by your caregiver.  RISKS AND COMPLICATIONS Take your time so you do not get dizzy or light-headed. If you are in pain, you may need to take or ask for pain medication before doing incentive spirometry. It is harder to take a deep breath if you are having pain. AFTER USE Rest and breathe slowly and easily. It can be helpful to keep track of a log of your progress. Your caregiver can provide you with a simple table to help with this. If you are using the spirometer at home, follow these instructions: [place] IF:  You are having difficultly using the spirometer. You have trouble using the spirometer as often as instructed. Your pain medication is not giving enough relief while using the spirometer. You develop fever of 100.5 F (38.1 C) or higher.                                                                                                    SEEK IMMEDIATE MEDICAL CARE IF:  You cough up bloody sputum that had not been present before. You develop fever of 102 F (38.9 C) or greater. You develop  worsening pain at or near the incision site. MAKE SURE YOU:  Understand these instructions. Will watch your condition. Will get help right away if you are not doing well or get worse. Document Released: 02/20/2007 Document Revised: 01/02/2012 Document Reviewed: 04/23/2007 [facility] Patient Information 2014 [place], [place].

## 2022-07-26 ENCOUNTER — Other Ambulatory Visit: Payer: Self-pay

## 2022-07-26 ENCOUNTER — Encounter (HOSPITAL_COMMUNITY)
Admission: RE | Admit: 2022-07-26 | Discharge: 2022-07-26 | Disposition: A | Discharge status: Home / Self Care | Payer: 59 | Source: Ambulatory Visit | Attending: Orthopaedic Surgery | Admitting: Orthopaedic Surgery

## 2022-07-26 ENCOUNTER — Encounter (HOSPITAL_COMMUNITY): Payer: Self-pay

## 2022-07-26 VITALS — BP 152/90 | HR 74 | Temp 98.4°F | Resp 20 | Ht 68.5 in | Wt 261.0 lb

## 2022-07-26 DIAGNOSIS — Z01818 Encounter for other preprocedural examination: Secondary | ICD-10-CM

## 2022-07-26 DIAGNOSIS — I10 Essential (primary) hypertension: Secondary | ICD-10-CM | POA: Insufficient documentation

## 2022-07-26 DIAGNOSIS — Z01812 Encounter for preprocedural laboratory examination: Secondary | ICD-10-CM | POA: Insufficient documentation

## 2022-07-26 HISTORY — DX: Unspecified osteoarthritis, unspecified site: M19.90

## 2022-07-26 LAB — BASIC METABOLIC PANEL
Anion gap: 6 (ref 5–15)
BUN: 13 mg/dL (ref 6–20)
CO2: 28 mmol/L (ref 22–32)
Calcium: 9.3 mg/dL (ref 8.9–10.3)
Chloride: 104 mmol/L (ref 98–111)
Creatinine, Ser: 0.59 mg/dL — ABNORMAL LOW (ref 0.61–1.24)
GFR, Estimated: >60 mL/min (ref >60)
Glucose, Bld: 102 mg/dL — ABNORMAL HIGH (ref 70–99)
Potassium: 4.4 mmol/L (ref 3.5–5.1)
Sodium: 138 mmol/L (ref 135–145)

## 2022-07-26 LAB — CBC
HCT: 43 % (ref 39.0–52.0)
Hemoglobin: 14.4 g/dL (ref 13.0–17.0)
MCH: 30.3 pg (ref 26.0–34.0)
MCHC: 33.5 g/dL (ref 30.0–36.0)
MCV: 90.3 fL (ref 80.0–100.0)
Platelets: 223 10*3/uL (ref 150–400)
RBC: 4.76 MIL/uL (ref 4.22–5.81)
RDW: 13.4 % (ref 11.5–15.5)
WBC: 5.2 10*3/uL (ref 4.0–10.5)
nRBC: 0 % (ref 0.0–0.2)

## 2022-07-26 LAB — SURGICAL PCR SCREEN
MRSA, PCR: NEGATIVE
Staphylococcus aureus: NEGATIVE

## 2022-08-04 NOTE — H&P (Signed)
TOTAL HIP ADMISSION H&P  Patient is admitted for left total hip arthroplasty.  Subjective:  Chief Complaint: left hip pain  HPI: Victor Ford, 51 y.o. male, has a history of pain and functional disability in the left hip(s) due to arthritis and patient has failed non-surgical conservative treatments for greater than 12 weeks to include NSAID's and/or analgesics, flexibility and strengthening excercises, weight reduction as appropriate, and activity modification.  Onset of symptoms was gradual starting 3 years ago with gradually worsening course since that time.The patient noted no past surgery on the left hip(s).  Patient currently rates pain in the left hip at 10 out of 10 with activity. Patient has night pain, worsening of pain with activity and weight bearing, pain that interfers with activities of daily living, and pain with passive range of motion. Patient has evidence of subchondral sclerosis, periarticular osteophytes, and joint space narrowing by imaging studies. This condition presents safety issues increasing the risk of falls.  There is no current active infection.  Patient Active Problem List   Diagnosis Date Noted   Status post total replacement of right hip 04/01/2022   Unilateral primary osteoarthritis, left hip 01/31/2022   Bilateral hip pain 01/14/2022   Morbid obesity ([place]) 01/13/2021   Routine general medical examination at a health care facility 01/14/2014   HTN (hypertension) 08/10/2011   ALLERGIC RHINITIS 10/04/2010   Hypothyroidism 12/11/2007   Past Medical History:  Diagnosis Date   Allergy    Arthritis    Hyperlipidemia    diet controlled/no meds   Hypertension    Hypothyroidism     Past Surgical History:  Procedure Laterality Date   COLONOSCOPY  2020   FINGER FRACTURE SURGERY     1980's Right 2nd/3rd finger repair   TOTAL HIP ARTHROPLASTY Right 04/01/2022   Procedure: RIGHT TOTAL HIP ARTHROPLASTY ANTERIOR APPROACH;  Surgeon: Mcarthur Rossetti, MD;   Location: [facility];  Service: Orthopedics;  Laterality: Right;    No current facility-administered medications for this encounter.   Current Outpatient Medications  Medication Sig Dispense Refill Last Dose   acetaminophen (TYLENOL) 500 MG tablet Take 1,000 mg by mouth every 6 (six) hours as needed for moderate pain.      cetirizine (ZYRTEC) 10 MG tablet Take 10 mg by mouth daily.      levothyroxine (SYNTHROID) 175 MCG tablet TAKE 1 TABLET BY MOUTH DAILY BEFORE BREAKFAST. 90 tablet 3    losartan-hydrochlorothiazide (HYZAAR) 50-12.5 MG tablet TAKE 1 TABLET BY MOUTH EVERY DAY 90 tablet 3    moxifloxacin (VIGAMOX) 0.5 % ophthalmic solution Place 1 drop into the right eye 2 (two) times daily.      Multiple Vitamin (MULTIVITAMIN WITH MINERALS) TABS tablet Take 1 tablet by mouth daily.      prednisoLONE acetate (PRED FORTE) 1 % ophthalmic suspension Place 1 drop into the right eye 2 (two) times daily.      tadalafil (CIALIS) 20 MG tablet Take 0.5-1 tablets (10-20 mg total) by mouth every other day as needed for erectile dysfunction. 10 tablet 11    trimethoprim-polymyxin b (POLYTRIM) ophthalmic solution Place 1 drop into the right eye 2 (two) times daily.      aspirin 81 MG chewable tablet Chew 1 tablet (81 mg total) by mouth 2 (two) times daily. (Patient not taking: Reported on 07/20/2022) 30 tablet 0 Completed Course   methocarbamol (ROBAXIN) 500 MG tablet Take 1 tablet (500 mg total) by mouth every 6 (six) hours as needed for muscle spasms. (Patient  not taking: Reported on 07/20/2022) 40 tablet 1 Completed Course   oxyCODONE (OXY IR/ROXICODONE) 5 MG immediate release tablet Take 1-2 tablets (5-10 mg total) by mouth every 4 (four) hours as needed for moderate pain (pain score 4-6). (Patient not taking: Reported on 07/20/2022) 30 tablet 0 Completed Course   No Known Allergies  Social History   Tobacco Use   Smoking status: Former    Packs/day: 1.00    Years: 15.00    Total pack years: 15.00     Types: Cigarettes    Quit date: 2002    Years since quitting: 21.7    Passive exposure: Never   Smokeless tobacco: Never  Substance Use Topics   Alcohol use: Yes    Alcohol/week: 6.0 standard drinks of alcohol    Types: 6 Standard drinks or equivalent per week    Family History  Problem Relation Age of Onset   Alcohol abuse Father    Hypertension Father    Dementia Paternal Uncle    Diabetes Neg Hx    Cancer Neg Hx    Heart disease Neg Hx    Colon cancer Neg Hx    Rectal cancer Neg Hx    Stomach cancer Neg Hx      Review of Systems  Musculoskeletal:  Positive for gait problem.  All other systems reviewed and are negative.   Objective:  Physical Exam Vitals reviewed.  Constitutional:      Appearance: Normal appearance.  HENT:     Head: Normocephalic and atraumatic.  Eyes:     Extraocular Movements: Extraocular movements intact.     Pupils: Pupils are equal, round, and reactive to light.  Cardiovascular:     Rate and Rhythm: Normal rate and regular rhythm.     Pulses: Normal pulses.  Pulmonary:     Effort: Pulmonary effort is normal.     Breath sounds: Normal breath sounds.  Abdominal:     Palpations: Abdomen is soft.  Musculoskeletal:     Cervical back: Normal range of motion and neck supple.     Left hip: Tenderness and bony tenderness present. Decreased range of motion. Decreased strength.  Neurological:     Mental Status: He is alert and oriented to person, place, and time.  Psychiatric:        Behavior: Behavior normal.     Vital signs in last 24 hours:    Labs:   Estimated body mass index is 39.11 kg/m as calculated from the following:   Height as of 07/26/22: 5' 8.5" (1.74 m).   Weight as of 07/26/22: 118.4 kg.   Imaging Review Plain radiographs demonstrate severe degenerative joint disease of the left hip(s). The bone quality appears to be excellent for age and reported activity level.      Assessment/Plan:  End stage arthritis, left  hip(s)  The patient history, physical examination, clinical judgement of the provider and imaging studies are consistent with end stage degenerative joint disease of the left hip(s) and total hip arthroplasty is deemed medically necessary. The treatment options including medical management, injection therapy, arthroscopy and arthroplasty were discussed at length. The risks and benefits of total hip arthroplasty were presented and reviewed. The risks due to aseptic loosening, infection, stiffness, dislocation/subluxation,  thromboembolic complications and other imponderables were discussed.  The patient acknowledged the explanation, agreed to proceed with the plan and consent was signed. Patient is being admitted for inpatient treatment for surgery, pain control, PT, OT, prophylactic antibiotics, VTE prophylaxis, progressive ambulation and  ADL's and discharge planning.The patient is planning to be discharged home with home health services

## 2022-08-05 ENCOUNTER — Observation Stay (HOSPITAL_COMMUNITY): Payer: 59

## 2022-08-05 ENCOUNTER — Other Ambulatory Visit: Payer: Self-pay

## 2022-08-05 ENCOUNTER — Ambulatory Visit (HOSPITAL_BASED_OUTPATIENT_CLINIC_OR_DEPARTMENT_OTHER): Payer: 59 | Admitting: Anesthesiology

## 2022-08-05 ENCOUNTER — Encounter (HOSPITAL_COMMUNITY)
Admission: RE | Disposition: A | Discharge status: Home Health | Payer: Self-pay | Source: Ambulatory Visit | Attending: Orthopaedic Surgery

## 2022-08-05 ENCOUNTER — Observation Stay (HOSPITAL_COMMUNITY)
Admission: RE | Admit: 2022-08-05 | Discharge: 2022-08-06 | Disposition: A | Discharge status: Home Health | Payer: 59 | Source: Ambulatory Visit | Attending: Orthopaedic Surgery | Admitting: Orthopaedic Surgery

## 2022-08-05 ENCOUNTER — Ambulatory Visit (HOSPITAL_COMMUNITY): Payer: 59 | Admitting: Anesthesiology

## 2022-08-05 ENCOUNTER — Ambulatory Visit (HOSPITAL_COMMUNITY): Payer: 59

## 2022-08-05 ENCOUNTER — Encounter (HOSPITAL_COMMUNITY): Payer: Self-pay | Admitting: Orthopaedic Surgery

## 2022-08-05 DIAGNOSIS — Z79899 Other long term (current) drug therapy: Secondary | ICD-10-CM | POA: Diagnosis not present

## 2022-08-05 DIAGNOSIS — Z87891 Personal history of nicotine dependence: Secondary | ICD-10-CM | POA: Insufficient documentation

## 2022-08-05 DIAGNOSIS — Z96651 Presence of right artificial knee joint: Secondary | ICD-10-CM | POA: Insufficient documentation

## 2022-08-05 DIAGNOSIS — M1612 Unilateral primary osteoarthritis, left hip: Secondary | ICD-10-CM | POA: Diagnosis present

## 2022-08-05 DIAGNOSIS — Z7982 Long term (current) use of aspirin: Secondary | ICD-10-CM | POA: Insufficient documentation

## 2022-08-05 DIAGNOSIS — E039 Hypothyroidism, unspecified: Secondary | ICD-10-CM | POA: Insufficient documentation

## 2022-08-05 DIAGNOSIS — I10 Essential (primary) hypertension: Secondary | ICD-10-CM | POA: Insufficient documentation

## 2022-08-05 DIAGNOSIS — Z96642 Presence of left artificial hip joint: Secondary | ICD-10-CM

## 2022-08-05 HISTORY — PX: TOTAL HIP ARTHROPLASTY: SHX124

## 2022-08-05 LAB — TYPE AND SCREEN
ABO/RH(D): A POS
Antibody Screen: NEGATIVE

## 2022-08-05 SURGERY — ARTHROPLASTY, HIP, TOTAL, ANTERIOR APPROACH
Anesthesia: Spinal | Site: Hip | Laterality: Left

## 2022-08-05 MED ORDER — POVIDONE-IODINE 10 % EX SWAB
2.0000 | Freq: Once | CUTANEOUS | Status: AC
  Administered 2022-08-05: 2 via TOPICAL

## 2022-08-05 MED ORDER — ONDANSETRON HCL 4 MG PO TABS: 4.0000 mg | ORAL_TABLET | Freq: Four times a day (QID) | ORAL | Status: DC | PRN

## 2022-08-05 MED ORDER — MIDAZOLAM HCL 2 MG/2ML IJ SOLN
INTRAMUSCULAR | Status: AC
  Filled 2022-08-05: qty 2

## 2022-08-05 MED ORDER — POLYMYXIN B-TRIMETHOPRIM 10000-0.1 UNIT/ML-% OP SOLN: 1.0000 [drp] | Freq: Two times a day (BID) | OPHTHALMIC | Status: DC

## 2022-08-05 MED ORDER — MIDAZOLAM HCL 5 MG/5ML IJ SOLN
INTRAMUSCULAR | Status: DC | PRN
  Administered 2022-08-05: 2 mg via INTRAVENOUS

## 2022-08-05 MED ORDER — ONDANSETRON HCL 4 MG/2ML IJ SOLN
4.0000 mg | Freq: Four times a day (QID) | INTRAMUSCULAR | Status: DC | PRN
  Administered 2022-08-05: 4 mg via INTRAVENOUS
  Filled 2022-08-05: qty 2

## 2022-08-05 MED ORDER — BUPIVACAINE IN DEXTROSE 0.75-8.25 % IT SOLN
INTRATHECAL | Status: DC | PRN
  Administered 2022-08-05: 1.8 mL via INTRATHECAL

## 2022-08-05 MED ORDER — PHENOL 1.4 % MT LIQD: 1.0000 | OROMUCOSAL | Status: DC | PRN

## 2022-08-05 MED ORDER — METOCLOPRAMIDE HCL 5 MG PO TABS: 5.0000 mg | ORAL_TABLET | Freq: Three times a day (TID) | ORAL | Status: DC | PRN

## 2022-08-05 MED ORDER — OXYCODONE HCL 5 MG PO TABS
5.0000 mg | ORAL_TABLET | ORAL | Status: DC | PRN
  Administered 2022-08-05 – 2022-08-06 (×3): 10 mg via ORAL
  Filled 2022-08-05 (×3): qty 2

## 2022-08-05 MED ORDER — OXYCODONE HCL 5 MG PO TABS: 5.0000 mg | ORAL_TABLET | Freq: Once | ORAL | Status: DC | PRN

## 2022-08-05 MED ORDER — ACETAMINOPHEN 10 MG/ML IV SOLN
1000.0000 mg | Freq: Once | INTRAVENOUS | Status: DC | PRN
  Administered 2022-08-05: 1000 mg via INTRAVENOUS

## 2022-08-05 MED ORDER — FENTANYL CITRATE (PF) 100 MCG/2ML IJ SOLN
INTRAMUSCULAR | Status: AC
  Filled 2022-08-05: qty 2

## 2022-08-05 MED ORDER — LOSARTAN POTASSIUM 50 MG PO TABS
50.0000 mg | ORAL_TABLET | Freq: Every day | ORAL | Status: DC
  Administered 2022-08-05 – 2022-08-06 (×2): 50 mg via ORAL
  Filled 2022-08-05 (×2): qty 1

## 2022-08-05 MED ORDER — ONDANSETRON HCL 4 MG/2ML IJ SOLN
INTRAMUSCULAR | Status: AC
  Filled 2022-08-05: qty 2

## 2022-08-05 MED ORDER — CEFAZOLIN SODIUM-DEXTROSE 1-4 GM/50ML-% IV SOLN
1.0000 g | Freq: Four times a day (QID) | INTRAVENOUS | Status: AC
  Administered 2022-08-05 (×2): 1 g via INTRAVENOUS
  Filled 2022-08-05 (×2): qty 50

## 2022-08-05 MED ORDER — MENTHOL 3 MG MT LOZG: 1.0000 | LOZENGE | OROMUCOSAL | Status: DC | PRN

## 2022-08-05 MED ORDER — ACETAMINOPHEN 10 MG/ML IV SOLN
INTRAVENOUS | Status: AC
  Filled 2022-08-05: qty 100

## 2022-08-05 MED ORDER — LACTATED RINGERS IV SOLN: INTRAVENOUS | Status: DC

## 2022-08-05 MED ORDER — ORAL CARE MOUTH RINSE: 15.0000 mL | Freq: Once | OROMUCOSAL | Status: AC

## 2022-08-05 MED ORDER — PANTOPRAZOLE SODIUM 40 MG PO TBEC
40.0000 mg | DELAYED_RELEASE_TABLET | Freq: Every day | ORAL | Status: DC
  Administered 2022-08-05 – 2022-08-06 (×2): 40 mg via ORAL
  Filled 2022-08-05 (×2): qty 1

## 2022-08-05 MED ORDER — HYDROMORPHONE HCL 1 MG/ML IJ SOLN: 0.5000 mg | INTRAMUSCULAR | Status: DC | PRN

## 2022-08-05 MED ORDER — GATIFLOXACIN 0.5 % OP SOLN: 1.0000 [drp] | Freq: Two times a day (BID) | OPHTHALMIC | Status: DC

## 2022-08-05 MED ORDER — LEVOTHYROXINE SODIUM 50 MCG PO TABS
175.0000 ug | ORAL_TABLET | Freq: Every day | ORAL | Status: DC
  Administered 2022-08-06: 175 ug via ORAL
  Filled 2022-08-05: qty 1

## 2022-08-05 MED ORDER — HYDROCHLOROTHIAZIDE 12.5 MG PO TABS
12.5000 mg | ORAL_TABLET | Freq: Every day | ORAL | Status: DC
  Administered 2022-08-05 – 2022-08-06 (×2): 12.5 mg via ORAL
  Filled 2022-08-05 (×2): qty 1

## 2022-08-05 MED ORDER — POLYETHYLENE GLYCOL 3350 17 G PO PACK: 17.0000 g | PACK | Freq: Every day | ORAL | Status: DC | PRN

## 2022-08-05 MED ORDER — PROPOFOL 10 MG/ML IV BOLUS
INTRAVENOUS | Status: DC | PRN
  Administered 2022-08-05: 20 mg via INTRAVENOUS

## 2022-08-05 MED ORDER — ALUM & MAG HYDROXIDE-SIMETH 200-200-20 MG/5ML PO SUSP: 30.0000 mL | ORAL | Status: DC | PRN

## 2022-08-05 MED ORDER — METHOCARBAMOL 500 MG PO TABS
500.0000 mg | ORAL_TABLET | Freq: Four times a day (QID) | ORAL | Status: DC | PRN
  Administered 2022-08-05: 500 mg via ORAL
  Filled 2022-08-05: qty 1

## 2022-08-05 MED ORDER — FENTANYL CITRATE (PF) 100 MCG/2ML IJ SOLN
INTRAMUSCULAR | Status: DC | PRN
  Administered 2022-08-05: 50 ug via INTRAVENOUS

## 2022-08-05 MED ORDER — DOCUSATE SODIUM 100 MG PO CAPS
100.0000 mg | ORAL_CAPSULE | Freq: Two times a day (BID) | ORAL | Status: DC
  Administered 2022-08-05 – 2022-08-06 (×2): 100 mg via ORAL
  Filled 2022-08-05 (×2): qty 1

## 2022-08-05 MED ORDER — DIPHENHYDRAMINE HCL 12.5 MG/5ML PO ELIX: 12.5000 mg | ORAL_SOLUTION | ORAL | Status: DC | PRN

## 2022-08-05 MED ORDER — METOCLOPRAMIDE HCL 5 MG/ML IJ SOLN: 5.0000 mg | Freq: Three times a day (TID) | INTRAMUSCULAR | Status: DC | PRN

## 2022-08-05 MED ORDER — SODIUM CHLORIDE 0.9 % IR SOLN
Status: DC | PRN
  Administered 2022-08-05: 1000 mL

## 2022-08-05 MED ORDER — PREDNISOLONE ACETATE 1 % OP SUSP
1.0000 [drp] | Freq: Two times a day (BID) | OPHTHALMIC | Status: DC
  Administered 2022-08-05 – 2022-08-06 (×2): 1 [drp] via OPHTHALMIC
  Filled 2022-08-05 (×2): qty 5

## 2022-08-05 MED ORDER — ASPIRIN 81 MG PO CHEW
81.0000 mg | CHEWABLE_TABLET | Freq: Two times a day (BID) | ORAL | Status: DC
  Administered 2022-08-05 – 2022-08-06 (×2): 81 mg via ORAL
  Filled 2022-08-05 (×2): qty 1

## 2022-08-05 MED ORDER — FENTANYL CITRATE PF 50 MCG/ML IJ SOSY: 25.0000 ug | PREFILLED_SYRINGE | INTRAMUSCULAR | Status: DC | PRN

## 2022-08-05 MED ORDER — OXYCODONE HCL 5 MG/5ML PO SOLN: 5.0000 mg | Freq: Once | ORAL | Status: DC | PRN

## 2022-08-05 MED ORDER — LOSARTAN POTASSIUM-HCTZ 50-12.5 MG PO TABS: 1.0000 | ORAL_TABLET | Freq: Every day | ORAL | Status: DC

## 2022-08-05 MED ORDER — METHOCARBAMOL 1000 MG/10ML IJ SOLN
500.0000 mg | Freq: Four times a day (QID) | INTRAVENOUS | Status: DC | PRN
  Filled 2022-08-05: qty 5

## 2022-08-05 MED ORDER — PHENYLEPHRINE HCL-NACL 20-0.9 MG/250ML-% IV SOLN
INTRAVENOUS | Status: DC | PRN
  Administered 2022-08-05: 25 ug/min via INTRAVENOUS

## 2022-08-05 MED ORDER — HYDROMORPHONE HCL 2 MG PO TABS
2.0000 mg | ORAL_TABLET | ORAL | Status: DC | PRN
  Administered 2022-08-05: 2 mg via ORAL
  Filled 2022-08-05: qty 1

## 2022-08-05 MED ORDER — PROPOFOL 10 MG/ML IV BOLUS
INTRAVENOUS | Status: AC
  Filled 2022-08-05: qty 20

## 2022-08-05 MED ORDER — TRANEXAMIC ACID-NACL 1000-0.7 MG/100ML-% IV SOLN
1000.0000 mg | INTRAVENOUS | Status: AC
  Administered 2022-08-05: 1000 mg via INTRAVENOUS
  Filled 2022-08-05: qty 100

## 2022-08-05 MED ORDER — STERILE WATER FOR IRRIGATION IR SOLN
Status: DC | PRN
  Administered 2022-08-05: 2000 mL

## 2022-08-05 MED ORDER — ACETAMINOPHEN 325 MG PO TABS
325.0000 mg | ORAL_TABLET | Freq: Four times a day (QID) | ORAL | Status: DC | PRN
  Filled 2022-08-05: qty 2

## 2022-08-05 MED ORDER — CHLORHEXIDINE GLUCONATE 0.12 % MT SOLN
15.0000 mL | Freq: Once | OROMUCOSAL | Status: AC
  Administered 2022-08-05: 15 mL via OROMUCOSAL

## 2022-08-05 MED ORDER — PROPOFOL 500 MG/50ML IV EMUL
INTRAVENOUS | Status: DC | PRN
  Administered 2022-08-05: 75 ug/kg/min via INTRAVENOUS

## 2022-08-05 MED ORDER — CEFAZOLIN SODIUM-DEXTROSE 2-4 GM/100ML-% IV SOLN
2.0000 g | INTRAVENOUS | Status: AC
  Administered 2022-08-05: 2 g via INTRAVENOUS
  Filled 2022-08-05: qty 100

## 2022-08-05 MED ORDER — 0.9 % SODIUM CHLORIDE (POUR BTL) OPTIME
TOPICAL | Status: DC | PRN
  Administered 2022-08-05: 1000 mL

## 2022-08-05 MED ORDER — PROMETHAZINE HCL 25 MG/ML IJ SOLN: 6.2500 mg | INTRAMUSCULAR | Status: DC | PRN

## 2022-08-05 MED ORDER — AMISULPRIDE (ANTIEMETIC) 5 MG/2ML IV SOLN: 10.0000 mg | Freq: Once | INTRAVENOUS | Status: DC | PRN

## 2022-08-05 MED ORDER — SODIUM CHLORIDE 0.9 % IV SOLN: INTRAVENOUS | Status: DC

## 2022-08-05 SURGICAL SUPPLY — 39 items
ACETAB CUP W/GRIPTION 54 (Plate) ×1 IMPLANT
BAG COUNTER SPONGE SURGICOUNT (BAG) ×1 IMPLANT
BAG ZIPLOCK 12X15 (MISCELLANEOUS) IMPLANT
BENZOIN TINCTURE PRP APPL 2/3 (GAUZE/BANDAGES/DRESSINGS) IMPLANT
BLADE SAW SGTL 18X1.27X75 (BLADE) ×1 IMPLANT
COVER PERINEAL POST (MISCELLANEOUS) ×1 IMPLANT
COVER SURGICAL LIGHT HANDLE (MISCELLANEOUS) ×1 IMPLANT
CUP ACETAB W/GRIPTION 54 (Plate) IMPLANT
DRAPE FOOT SWITCH (DRAPES) ×1 IMPLANT
DRAPE STERI IOBAN 125X83 (DRAPES) ×1 IMPLANT
DRAPE U-SHAPE 47X51 STRL (DRAPES) ×2 IMPLANT
DRSG AQUACEL AG ADV 3.5X10 (GAUZE/BANDAGES/DRESSINGS) ×1 IMPLANT
DURAPREP 26ML APPLICATOR (WOUND CARE) ×1 IMPLANT
ELECT REM PT RETURN 15FT ADLT (MISCELLANEOUS) ×1 IMPLANT
GAUZE XEROFORM 1X8 LF (GAUZE/BANDAGES/DRESSINGS) ×1 IMPLANT
GLOVE BIO SURGEON STRL SZ7.5 (GLOVE) ×1 IMPLANT
GLOVE BIOGEL PI IND STRL 8 (GLOVE) ×2 IMPLANT
GLOVE ECLIPSE 8.0 STRL XLNG CF (GLOVE) ×1 IMPLANT
GOWN STRL REUS W/ TWL XL LVL3 (GOWN DISPOSABLE) ×2 IMPLANT
GOWN STRL REUS W/TWL XL LVL3 (GOWN DISPOSABLE) ×2
HANDPIECE INTERPULSE COAX TIP (DISPOSABLE) ×1
HEAD CERAMIC 36 PLUS5 (Hips) IMPLANT
HOLDER FOLEY CATH W/STRAP (MISCELLANEOUS) ×1 IMPLANT
KIT TURNOVER KIT A (KITS) IMPLANT
LINER NEUTRAL 54X36MM PLUS 4 (Hips) IMPLANT
PACK ANTERIOR HIP CUSTOM (KITS) ×1 IMPLANT
SET HNDPC FAN SPRY TIP SCT (DISPOSABLE) ×1 IMPLANT
STAPLER VISISTAT 35W (STAPLE) IMPLANT
STEM FEMORAL SZ5 HIGH ACTIS (Stem) IMPLANT
STRIP CLOSURE SKIN 1/2X4 (GAUZE/BANDAGES/DRESSINGS) IMPLANT
SUT ETHIBOND NAB CT1 #1 30IN (SUTURE) ×1 IMPLANT
SUT ETHILON 2 0 PS N (SUTURE) IMPLANT
SUT MNCRL AB 4-0 PS2 18 (SUTURE) IMPLANT
SUT VIC AB 0 CT1 36 (SUTURE) ×1 IMPLANT
SUT VIC AB 1 CT1 36 (SUTURE) ×1 IMPLANT
SUT VIC AB 2-0 CT1 27 (SUTURE) ×2
SUT VIC AB 2-0 CT1 TAPERPNT 27 (SUTURE) ×2 IMPLANT
TRAY FOLEY MTR SLVR 16FR STAT (SET/KITS/TRAYS/PACK) IMPLANT
YANKAUER SUCT BULB TIP NO VENT (SUCTIONS) ×1 IMPLANT

## 2022-08-05 NOTE — Progress Notes (Signed)
Attempted report x1. RN will call back for report.

## 2022-08-05 NOTE — Evaluation (Signed)
Physical Therapy Evaluation Patient Details Name: Victor Ford MRN: [id number] DOB: Sep 03, 1971 Today's Date: 08/05/2022  History of Present Illness  Pt s/p L THR and with hx of R THR (6/23) and morbid obesity  Clinical Impression  Pt s/p L THR and presents with decreased L LE strength/ROM, post op pain and obesity limiting functional mobility.  Pt should progress to dc home with family assist.     Recommendations for follow up therapy are one component of a multi-disciplinary discharge planning process, led by the attending physician.  Recommendations may be updated based on patient status, additional functional criteria and insurance authorization.  Follow Up Recommendations Follow physician's recommendations for discharge plan and follow up therapies      Assistance Recommended at Discharge PRN  Patient can return home with the following  A little help with walking and/or transfers;A little help with bathing/dressing/bathroom;Assistance with cooking/housework;Assist for transportation;Help with stairs or ramp for entrance    Equipment Recommendations None recommended by PT  Recommendations for Other Services       Functional Status Assessment Patient has had a recent decline in their functional status and demonstrates the ability to make significant improvements in function in a reasonable and predictable amount of time.     Precautions / Restrictions Precautions Precautions: Fall Restrictions Weight Bearing Restrictions: No Other Position/Activity Restrictions: WBAT      Mobility  Bed Mobility Overal bed mobility: Needs Assistance Bed Mobility: Supine to Sit     Supine to sit: Min guard     General bed mobility comments: for safety    Transfers Overall transfer level: Needs assistance Equipment used: Rolling walker (2 wheels) Transfers: Sit to/from Stand Sit to Stand: Min guard           General transfer comment: Steady assist with cues for use of UEs  to self assist    Ambulation/Gait Ambulation/Gait assistance: Min guard Gait Distance (Feet): 85 Feet Assistive device: Rolling walker (2 wheels) Gait Pattern/deviations: Step-to pattern, Step-through pattern       General Gait Details: cues for posture, position from RW and initial sequence  Stairs            Wheelchair Mobility    Modified Rankin (Stroke Patients Only)       Balance Overall balance assessment: Mild deficits observed, not formally tested                                           Pertinent Vitals/Pain Pain Assessment Pain Assessment: 0-10 Pain Score: 4  Pain Location: L hip Pain Descriptors / Indicators: Aching, Sore Pain Intervention(s): Limited activity within patient's tolerance, Monitored during session, Premedicated before session, Ice applied    Home Living Family/patient expects to be discharged to:: Private residence Living Arrangements: Spouse/significant other Available Help at Discharge: Family;Available 24 hours/day Type of Home: House Home Access: Stairs to enter Entrance Stairs-Rails: None Entrance Stairs-Number of Steps: 3 Alternate Level Stairs-Number of Steps: 15 Home Layout: Two level;Bed/bath upstairs Home Equipment: BSC/3in1;Rolling Walker (2 wheels);Cane - single point      Prior Function Prior Level of Function : Independent/Modified Independent;Driving;Working/employed                     Hand Dominance        Extremity/Trunk Assessment   Upper Extremity Assessment Upper Extremity Assessment: Overall WFL for tasks assessed  Lower Extremity Assessment Lower Extremity Assessment: LLE deficits/detail    Cervical / Trunk Assessment Cervical / Trunk Assessment: Normal  Communication   Communication: No difficulties  Cognition Arousal/Alertness: Awake/alert Behavior During Therapy: WFL for tasks assessed/performed Overall Cognitive Status: Within Functional Limits for tasks  assessed                                          General Comments      Exercises Total Joint Exercises Ankle Circles/Pumps: AROM, 15 reps, Supine   Assessment/Plan    PT Assessment Patient needs continued PT services  PT Problem List Decreased strength;Decreased range of motion;Decreased activity tolerance;Decreased balance;Decreased mobility;Decreased knowledge of use of DME;Obesity;Pain       PT Treatment Interventions DME instruction;Gait training;Stair training;Functional mobility training;Therapeutic activities;Therapeutic exercise;Patient/family education    PT Goals (Current goals can be found in the Care Plan section)  Acute Rehab PT Goals Patient Stated Goal: Regain IND PT Goal Formulation: With patient Time For Goal Achievement: 08/12/22 Potential to Achieve Goals: Good    Frequency 7X/week     Co-evaluation               AM-PAC PT "6 Clicks" Mobility  Outcome Measure Help needed turning from your back to your side while in a flat bed without using bedrails?: A Little Help needed moving from lying on your back to sitting on the side of a flat bed without using bedrails?: A Little Help needed moving to and from a bed to a chair (including a wheelchair)?: A Little Help needed standing up from a chair using your arms (e.g., wheelchair or bedside chair)?: A Little Help needed to walk in hospital room?: A Little Help needed climbing 3-5 steps with a railing? : A Little 6 Click Score: 18    End of Session Equipment Utilized During Treatment: Gait belt Activity Tolerance: Patient tolerated treatment well Patient left: in chair;with call bell/phone within reach;with chair alarm set Nurse Communication: Mobility status PT Visit Diagnosis: Difficulty in walking, not elsewhere classified (R26.2)    Time: 4008-6761 PT Time Calculation (min) (ACUTE ONLY): 15 min   Charges:   PT Evaluation $PT Eval Low Complexity: 1 Low          [place] Pager [phone] Office [phone]   Adna Nofziger 08/05/2022, 6:07 PM

## 2022-08-05 NOTE — Anesthesia Procedure Notes (Signed)
Procedure Name: MAC Date/Time: 08/05/2022 11:51 AM  Performed by: Glory Buff, CRNAOxygen Delivery Method: Simple face mask

## 2022-08-05 NOTE — Anesthesia Procedure Notes (Signed)
Spinal  Patient location during procedure: OR Start time: 08/05/2022 11:55 AM End time: 08/05/2022 12:00 PM Reason for block: surgical anesthesia Staffing Performed: anesthesiologist  Anesthesiologist: Murvin Natal, MD Performed by: Murvin Natal, MD Authorized by: Murvin Natal, MD   Preanesthetic Checklist Completed: patient identified, IV checked, risks and benefits discussed, surgical consent, monitors and equipment checked, pre-op evaluation and timeout performed Spinal Block Patient position: sitting Prep: DuraPrep Patient monitoring: cardiac monitor, continuous pulse ox and blood pressure Approach: midline Location: L4-5 Injection technique: single-shot Needle Needle type: Pencan  Needle gauge: 24 G Needle length: 9 cm Assessment Sensory level: T10 Events: CSF return Additional Notes Functioning IV was confirmed and monitors were applied. Sterile prep and drape, including hand hygiene and sterile gloves were used. The patient was positioned and the spine was prepped. The skin was anesthetized with lidocaine.  Free flow of clear CSF was obtained prior to injecting local anesthetic into the CSF.  The spinal needle aspirated freely following injection.  The needle was carefully withdrawn.  The patient tolerated the procedure well.

## 2022-08-05 NOTE — Plan of Care (Signed)
Discuss and reviewed care plan with patient

## 2022-08-05 NOTE — Op Note (Signed)
Operative Note  Date of operation: 08/05/2022 Preoperative diagnosis: Left hip osteoarthritis Postoperative diagnosis: Same  Procedure: Left anterior total hip arthroplasty  Implants: DePuy sector GRIPTION acetabular component size 54, size 36+4 polythene liner, size 5 Actis femoral component with high offset, 36+5 ceramic head ball  Surgeon: Mcarthur Rossetti MD Assistant: Benita Stabile, PA-C  Anesthesia: Spinal EBL: 400 cc Antibiotics: 2 g IV Ancef Complications: None  Indications: Victor Ford is a very pleasant 51 year old gentleman well-known to Ford.  He unfortunately has debilitating arthritis involving both his hips.  He used to be quite obese and has been on a weight loss journey with a BMI down down to 39.  We actually successfully replaced his right hip just a few months ago in this year.  His right hip is done very well.  His left hip pain is daily and x-rays show bone-on-bone wear of the left hip.  At this point having had a successful right total hip arthroplasty and given the pain and disability that his left hip is causing him, he does wish to proceed with a total hip arthroplasty on the left side and we agree with this as well based on his clinical exam and x-ray findings.  Having had this before he is fully aware of the risk of acute blood loss anemia, nerve vessel injury, fracture, infection, dislocation, DVT, implant failure, leg length differences and wound healing issues.  He understands her goals are hopefully decrease pain, improve mobility and overall improve quality of life.  Procedure description: After informed consent was obtained and appropriate left hip was marked the patient was brought to the operating room and set up on a stretcher where spinal anesthesia was obtained.  He was then laid in supine position on stretcher a Foley catheter was placed and traction boots were placed on both his feet next he was placed supine on the Hana fracture table with a perineal post  in place in both legs and inline skeletal traction devices but no traction applied.  I assessed his left hip radiographically first and then we prepped it with DuraPrep and sterile drapes.  A timeout was called he was identified as the correct patient and the correct left hip.  An incision was made just inferior and posterior to the ASIS and carried slightly obliquely down the leg.  Dissection was carried down the tensor fascia lata muscle and the tensor fascia was then divided longitudinally to proceed with directed approach to the hip.  Circumflex vessels were identified and cauterized and hip capsule identified and opened up finding a moderate joint effusion.  Cobra retractors were placed around the medial lateral femoral neck and a femoral neck cut was made with an oscillating saw just proximal to the lesser trochanter and completed with an osteotome.  A corkscrew guide was placed in the femoral head and the femoral head was removed in its entirety and there was a wide area devoid of cartilage and it was significantly deformed.  A bent Hohmann was then placed over the medial acetabular rim and remnants of the acetabular labrum and other debris were removed.  I then began reaming under regularization from a size 43 reamer and stepwise increments going up to size 53 reamer with all reamers placed under direct visitation in the last reamer was placed in direct fluoroscopy psych obtain my depth of reaming my ankle nation and anteversion.  I then placed the real DePuy sector GRIPTION acetabular component size 54 and a 36+4 polythene liner for that  size acetabular component.  Attention was then turned to the femur.  The left leg was externally rotated 220 degrees, extended and brought into the other leg.  A medial retractor was placed medially and a Hohmann retractor was placed behind the greater trochanter.  The lateral joint capsule was released and a box cutting osteotome was used to enter the femoral canal.   Broaching was then started from a size 0 broach going up and stepwise increments to a size 5 broach.  With size about broach in place we trialed a high offset femoral neck and a 36+15 hip ball and reduced this into the acetabulum.  This was after breaking her leg overnight.  We then assessed it radiographically and clinically and I felt like we needed just a little bit more leg length.  We dislocated the hip remove the trial components.  We placed the real Actis femoral component with high offset size 5 and we went with a 36+5 ceramic hip ball and again reduce this in the acetabulum and we are pleased with leg and also range of motion and stability assessed mechanically and radiographically.  The soft tissue was then irrigated normal saline solution using pulsatile lavage.  The joint capsule was closed with interrupted #1 Ethibond suture followed by #1 Vicryl close the tensor fascia.  0 Vicryl used to close the deep tissue and 2-0 Vicryl was used to close the subcutaneous tissue.  The skin was closed with staples.  An Aquacel dressing was applied.  The patient was taken off the Hana table and taken recovery room in stable addition with all final counts being correct and no complications noted.  Of note Benita Stabile, PA-C did assist in the entire case from beginning to end and his assistance was medically necessary and needed for soft tissue retraction, helping guide implant placement and a layered closure of the wound.

## 2022-08-05 NOTE — Transfer of Care (Signed)
Immediate Anesthesia Transfer of Care Note  Patient: Victor Ford  Procedure(s) Performed: LEFT TOTAL HIP ARTHROPLASTY ANTERIOR APPROACH (Left: Hip)  Patient Location: PACU  Anesthesia Type:Spinal  Level of Consciousness: drowsy, patient cooperative and responds to stimulation  Airway & Oxygen Therapy: Patient Spontanous Breathing and Patient connected to face mask oxygen  Post-op Assessment: Report given to RN and Post -op Vital signs reviewed and stable  Post vital signs: Reviewed and stable  Last Vitals:  Vitals Value Taken Time  BP 117/73 08/05/22 1339  Temp    Pulse 72 08/05/22 1340  Resp 13 08/05/22 1340  SpO2 100 % 08/05/22 1340  Vitals shown include unvalidated device data.  Last Pain:  Vitals:   08/05/22 0949  TempSrc:   PainSc: 4       Patients Stated Pain Goal: 4 (07/62/26 3335)  Complications: No notable events documented.

## 2022-08-05 NOTE — Anesthesia Preprocedure Evaluation (Addendum)
Anesthesia Evaluation  Patient identified by MRN, date of birth, ID band Patient awake    Reviewed: Allergy & Precautions, NPO status , Patient's Chart, lab work & pertinent test results  Airway Mallampati: II  TM Distance: >3 FB Neck ROM: Full    Dental no notable dental hx.    Pulmonary former smoker,    Pulmonary exam normal        Cardiovascular hypertension, Pt. on medications Normal cardiovascular exam     Neuro/Psych negative neurological ROS  negative psych ROS   GI/Hepatic negative GI ROS, Neg liver ROS,   Endo/Other  Hypothyroidism   Renal/GU negative Renal ROS     Musculoskeletal  (+) Arthritis ,   Abdominal (+) + obese,   Peds  Hematology negative hematology ROS (+)   Anesthesia Other Findings OSTEOARTHRITIS LEFT HIP  Reproductive/Obstetrics                            Anesthesia Physical Anesthesia Plan  ASA: 2  Anesthesia Plan: Spinal   Post-op Pain Management:    Induction: Intravenous  PONV Risk Score and Plan: 1 and Ondansetron, Dexamethasone, Propofol infusion, Midazolam and Treatment may vary due to age or medical condition  Airway Management Planned: Simple Face Mask  Additional Equipment:   Intra-op Plan:   Post-operative Plan: Extubation in OR  Informed Consent: I have reviewed the patients History and Physical, chart, labs and discussed the procedure including the risks, benefits and alternatives for the proposed anesthesia with the patient or authorized representative who has indicated his/her understanding and acceptance.     Dental advisory given  Plan Discussed with: CRNA  Anesthesia Plan Comments:         Anesthesia Quick Evaluation

## 2022-08-05 NOTE — Interval H&P Note (Signed)
History and Physical Interval Note: The patient understands that he is here today for a left hip replacement to treat his severe left hip arthritis.  There has been no acute or interval changes medical status.  See H&P.  The risks and benefits of surgery been explained in detail and informed consent is obtained.  The left hip has been marked.  08/05/2022 10:15 AM  Victor Ford  has presented today for surgery, with the diagnosis of OSTEOARTHRITIS LEFT HIP.  The various methods of treatment have been discussed with the patient and family. After consideration of risks, benefits and other options for treatment, the patient has consented to  Procedure(s): LEFT TOTAL HIP ARTHROPLASTY ANTERIOR APPROACH (Left) as a surgical intervention.  The patient's history has been reviewed, patient examined, no change in status, stable for surgery.  I have reviewed the patient's chart and labs.  Questions were answered to the patient's satisfaction.     Mcarthur Rossetti

## 2022-08-05 NOTE — Anesthesia Postprocedure Evaluation (Signed)
Anesthesia Post Note  Patient: Victor Ford  Procedure(s) Performed: LEFT TOTAL HIP ARTHROPLASTY ANTERIOR APPROACH (Left: Hip)     Patient location during evaluation: PACU Anesthesia Type: Spinal Level of consciousness: awake Pain management: pain level controlled Vital Signs Assessment: post-procedure vital signs reviewed and stable Respiratory status: spontaneous breathing, nonlabored ventilation, respiratory function stable and patient connected to nasal cannula oxygen Cardiovascular status: stable and blood pressure returned to baseline Postop Assessment: no apparent nausea or vomiting Anesthetic complications: no   No notable events documented.  Last Vitals:  Vitals:   08/05/22 1430 08/05/22 1502  BP: (!) 141/91 (!) 138/92  Pulse: (!) 59 63  Resp: 10 16  Temp:  36.6 C  SpO2: 99% 100%    Last Pain:  Vitals:   08/05/22 1610  TempSrc:   PainSc: 1                  Victor Ford

## 2022-08-06 DIAGNOSIS — M1612 Unilateral primary osteoarthritis, left hip: Secondary | ICD-10-CM | POA: Diagnosis not present

## 2022-08-06 LAB — CBC
HCT: 32.7 % — ABNORMAL LOW (ref 39.0–52.0)
Hemoglobin: 10.9 g/dL — ABNORMAL LOW (ref 13.0–17.0)
MCH: 30.4 pg (ref 26.0–34.0)
MCHC: 33.3 g/dL (ref 30.0–36.0)
MCV: 91.1 fL (ref 80.0–100.0)
Platelets: 207 10*3/uL (ref 150–400)
RBC: 3.59 MIL/uL — ABNORMAL LOW (ref 4.22–5.81)
RDW: 13.2 % (ref 11.5–15.5)
WBC: 12.8 10*3/uL — ABNORMAL HIGH (ref 4.0–10.5)
nRBC: 0 % (ref 0.0–0.2)

## 2022-08-06 LAB — BASIC METABOLIC PANEL
Anion gap: 6 (ref 5–15)
BUN: 22 mg/dL — ABNORMAL HIGH (ref 6–20)
CO2: 25 mmol/L (ref 22–32)
Calcium: 8.3 mg/dL — ABNORMAL LOW (ref 8.9–10.3)
Chloride: 106 mmol/L (ref 98–111)
Creatinine, Ser: 0.87 mg/dL (ref 0.61–1.24)
GFR, Estimated: >60 mL/min (ref >60)
Glucose, Bld: 129 mg/dL — ABNORMAL HIGH (ref 70–99)
Potassium: 4.5 mmol/L (ref 3.5–5.1)
Sodium: 137 mmol/L (ref 135–145)

## 2022-08-06 MED ORDER — HYDROMORPHONE HCL 2 MG PO TABS: 2.0000 mg | ORAL_TABLET | ORAL | 0 refills | Status: DC | PRN

## 2022-08-06 MED ORDER — ASPIRIN 81 MG PO CHEW: 81.0000 mg | CHEWABLE_TABLET | Freq: Two times a day (BID) | ORAL | 0 refills | Status: DC

## 2022-08-06 MED ORDER — METHOCARBAMOL 500 MG PO TABS: 500.0000 mg | ORAL_TABLET | Freq: Four times a day (QID) | ORAL | 1 refills | Status: DC | PRN

## 2022-08-06 NOTE — Progress Notes (Signed)
08/06/22 1200  PT Visit Information  Last PT Received On 08/06/22  Assistance Needed +1  History of Present Illness Pt s/p L THR and with hx of R THR (6/23) and morbid obesity; Pt continues to do well, meeting goals, reviewed stairs prior to d/c as pt plans to go up to second level bedroom.   Subjective Data  Patient Stated Goal Regain IND  Precautions  Precautions Fall  Restrictions  Weight Bearing Restrictions No  Other Position/Activity Restrictions WBAT  Pain Assessment  Pain Assessment 0-10  Pain Score 5  Pain Location L hip  Pain Descriptors / Indicators Aching;Sore  Pain Intervention(s) Limited activity within patient's tolerance;Monitored during session;Premedicated before session  Cognition  Arousal/Alertness Awake/alert  Behavior During Therapy WFL for tasks assessed/performed  Overall Cognitive Status Within Functional Limits for tasks assessed  Bed Mobility  Overal bed mobility Needs Assistance  Bed Mobility Supine to Sit  Supine to sit Supervision  General bed mobility comments for safety  Transfers  Overall transfer level Needs assistance  Equipment used Rolling walker (2 wheels)  Transfers Sit to/from Stand  Sit to Stand Supervision  General transfer comment cues for use of UEs to self assist  Ambulation/Gait  Ambulation/Gait assistance Supervision;Modified independent (Device/Increase time)  Gait Distance (Feet) 180 Feet  Assistive device Rolling walker (2 wheels)  Gait Pattern/deviations Step-to pattern;Step-through pattern;Decreased step length - left  General Gait Details cues for posture, position from RW and initial sequence; progressing to step through with improved wt shift to LLE  Stairs Yes  Stairs assistance Min guard;Supervision  Stair Management Two rails;One rail Right  Number of Stairs 7 (x2)  General stair comments cues for sequence; good stability, no LOB  Balance  Overall balance assessment Mild deficits observed, not formally tested   Exercises  Exercises Total Joint  Total Joint Exercises  Ankle Circles/Pumps AROM;Supine;10 reps  PT - End of Session  Equipment Utilized During Treatment Gait belt  Activity Tolerance Patient tolerated treatment well  Patient left with call bell/phone within reach;in bed;with bed alarm set  Nurse Communication Mobility status   PT - Assessment/Plan  PT Plan Current plan remains appropriate  PT Visit Diagnosis Difficulty in walking, not elsewhere classified (R26.2)  PT Frequency (ACUTE ONLY) 7X/week  Follow Up Recommendations Follow physician's recommendations for discharge plan and follow up therapies  Assistance recommended at discharge PRN  Patient can return home with the following A little help with walking and/or transfers;A little help with bathing/dressing/bathroom;Assistance with cooking/housework;Assist for transportation;Help with stairs or ramp for entrance  PT equipment None recommended by PT  AM-PAC PT "6 Clicks" Mobility Outcome Measure (Version 2)  Help needed turning from your back to your side while in a flat bed without using bedrails? 3  Help needed moving from lying on your back to sitting on the side of a flat bed without using bedrails? 4  Help needed moving to and from a bed to a chair (including a wheelchair)? 3  Help needed standing up from a chair using your arms (e.g., wheelchair or bedside chair)? 3  Help needed to walk in hospital room? 3  Help needed climbing 3-5 steps with a railing?  3  6 Click Score 19  Consider Recommendation of Discharge To: Home with [facility]  Progressive Mobility  What is the highest level of mobility based on the progressive mobility assessment? Level 5 (Walks with assist in room/hall) - Balance while stepping forward/back and can walk in room with assist - Complete  PT Goal Progression  Progress towards PT goals Progressing toward goals  Acute Rehab PT Goals  PT Goal Formulation With patient  Time For Goal Achievement 08/12/22   Potential to Achieve Goals Good  PT Time Calculation  PT Start Time (ACUTE ONLY) 1201  PT Stop Time (ACUTE ONLY) 1221  PT Time Calculation (min) (ACUTE ONLY) 20 min  PT General Charges  $$ ACUTE PT VISIT 1 Visit  PT Treatments  $Gait Training 8-22 mins

## 2022-08-06 NOTE — Progress Notes (Signed)
Physical Therapy Treatment Patient Details Name: Victor Ford MRN: [id number] DOB: May 02, 1971 Today's Date: 08/06/2022   History of Present Illness Pt s/p L THR and with hx of R THR (6/23) and morbid obesity    PT Comments    Pt is progress very well, meeting goals. Pt plans to go up to second level bedroom, wants to review stairs so will see for a second session and pt will be ready to d/c from PT standpoint.  Recommendations for follow up therapy are one component of a multi-disciplinary discharge planning process, led by the attending physician.  Recommendations may be updated based on patient status, additional functional criteria and insurance authorization.  Follow Up Recommendations  Follow physician's recommendations for discharge plan and follow up therapies     Assistance Recommended at Discharge PRN  Patient can return home with the following A little help with walking and/or transfers;A little help with bathing/dressing/bathroom;Assistance with cooking/housework;Assist for transportation;Help with stairs or ramp for entrance   Equipment Recommendations  None recommended by PT    Recommendations for Other Services       Precautions / Restrictions Precautions Precautions: Fall Restrictions Weight Bearing Restrictions: No Other Position/Activity Restrictions: WBAT     Mobility  Bed Mobility Overal bed mobility: Needs Assistance Bed Mobility: Supine to Sit     Supine to sit: Supervision     General bed mobility comments: for safety    Transfers Overall transfer level: Needs assistance Equipment used: Rolling walker (2 wheels) Transfers: Sit to/from Stand Sit to Stand: Supervision           General transfer comment: cues for use of UEs to self assist    Ambulation/Gait Ambulation/Gait assistance: Supervision Gait Distance (Feet): 160 Feet Assistive device: Rolling walker (2 wheels) Gait Pattern/deviations: Step-to pattern, Step-through  pattern, Decreased step length - left       General Gait Details: cues for posture, position from RW and initial sequence   Stairs             Wheelchair Mobility    Modified Rankin (Stroke Patients Only)       Balance Overall balance assessment: Mild deficits observed, not formally tested                                          Cognition Arousal/Alertness: Awake/alert Behavior During Therapy: WFL for tasks assessed/performed Overall Cognitive Status: Within Functional Limits for tasks assessed                                          Exercises Total Joint Exercises Ankle Circles/Pumps: AROM, Supine, 10 reps Quad Sets: AROM, Both, 10 reps Heel Slides: AAROM, Left, 5 reps (self assists with gait belt)    General Comments        Pertinent Vitals/Pain Pain Assessment Pain Assessment: 0-10 Pain Score: 5  Pain Location: L hip Pain Descriptors / Indicators: Aching, Sore Pain Intervention(s): Limited activity within patient's tolerance, Monitored during session, Premedicated before session, Repositioned, Ice applied    Home Living                          Prior Function            PT Goals (current goals can  now be found in the care plan section) Acute Rehab PT Goals Patient Stated Goal: Regain IND PT Goal Formulation: With patient Time For Goal Achievement: 08/12/22 Potential to Achieve Goals: Good Progress towards PT goals: Progressing toward goals    Frequency    7X/week      PT Plan Current plan remains appropriate    Co-evaluation              AM-PAC PT "6 Clicks" Mobility   Outcome Measure  Help needed turning from your back to your side while in a flat bed without using bedrails?: A Little Help needed moving from lying on your back to sitting on the side of a flat bed without using bedrails?: None Help needed moving to and from a bed to a chair (including a wheelchair)?: A  Little Help needed standing up from a chair using your arms (e.g., wheelchair or bedside chair)?: A Little Help needed to walk in hospital room?: A Little Help needed climbing 3-5 steps with a railing? : A Little 6 Click Score: 19    End of Session Equipment Utilized During Treatment: Gait belt Activity Tolerance: Patient tolerated treatment well Patient left: with call bell/phone within reach;in bed;with bed alarm set Nurse Communication: Mobility status PT Visit Diagnosis: Difficulty in walking, not elsewhere classified (R26.2)     Time: 0943-1000 PT Time Calculation (min) (ACUTE ONLY): 17 min  Charges:  $Gait Training: 8-22 mins                     Baxter Flattery, PT  Acute Rehab Dept [facility]) [phone]  [facility] Weekend Pager (Saturday/Sunday only)  [phone]  08/06/2022    [facility] 08/06/2022, 10:39 AM

## 2022-08-06 NOTE — Progress Notes (Signed)
Subjective: 1 Day Post-Op Procedure(s) (LRB): LEFT TOTAL HIP ARTHROPLASTY ANTERIOR APPROACH (Left) Patient reports pain as moderate.  Walked in hallway with PT. Has no complaints. Wants to discharge to home later today.   Objective: Vital signs in last 24 hours: Temp:  [97.7 F (36.5 C)-98.5 F (36.9 C)] 98.3 F (36.8 C) (10/14 1026) Pulse Rate:  [59-115] 115 (10/14 1026) Resp:  [9-24] 20 (10/14 1026) BP: (110-141)/(73-92) 132/75 (10/14 1026) SpO2:  [96 %-100 %] 98 % (10/14 1026)  Intake/Output from previous day: 10/13 0701 - 10/14 0700 In: 4617.9 [P.O.:780; I.V.:3037.9; IV Piggyback:350] Out: 2750 [Urine:2350; Blood:400] Intake/Output this shift: No intake/output data recorded.  Recent Labs    08/06/22 0333  HGB 10.9*   Recent Labs    08/06/22 0333  WBC 12.8*  RBC 3.59*  HCT 32.7*  PLT 207   Recent Labs    08/06/22 0333  NA 137  K 4.5  CL 106  CO2 25  BUN 22*  CREATININE 0.87  GLUCOSE 129*  CALCIUM 8.3*   No results for input(s): "LABPT", "INR" in the last 72 hours.  Sensation intact distally Intact pulses distally Dorsiflexion/Plantar flexion intact Incision: dressing C/D/I and scant drainage Compartment soft   Assessment/Plan: 1 Day Post-Op Procedure(s) (LRB): LEFT TOTAL HIP ARTHROPLASTY ANTERIOR APPROACH (Left) Discharge home with home health     Celina 08/06/2022, 11:21 AM

## 2022-08-06 NOTE — Discharge Instructions (Signed)

## 2022-08-06 NOTE — Discharge Summary (Signed)
Patient ID: NORMON PETTIJOHN MRN: [id number] DOB/AGE: [birth year] 51 y.o.  Admit date: 08/05/2022 Discharge date: 08/06/2022  Admission Diagnoses:  Principal Problem:   Unilateral primary osteoarthritis, left hip Active Problems:   Status post total replacement of left hip   Discharge Diagnoses:  Same  Past Medical History:  Diagnosis Date   Allergy    Arthritis    Hyperlipidemia    diet controlled/no meds   Hypertension    Hypothyroidism     Surgeries: Procedure(s): LEFT TOTAL HIP ARTHROPLASTY ANTERIOR APPROACH on 08/05/2022   Consultants:   Discharged Condition: Improved  Hospital Course: SERGEI DELO is an 51 y.o. male who was admitted 08/05/2022 for operative treatment ofUnilateral primary osteoarthritis, left hip. Patient has severe unremitting pain that affects sleep, daily activities, and work/hobbies. After pre-op clearance the patient was taken to the operating room on 08/05/2022 and underwent  Procedure(s): LEFT TOTAL HIP ARTHROPLASTY ANTERIOR APPROACH.    Patient was given perioperative antibiotics:  Anti-infectives (From admission, onward)    Start     Dose/Rate Route Frequency Ordered Stop   08/05/22 1800  ceFAZolin (ANCEF) IVPB 1 g/50 mL premix        1 g 100 mL/hr over 30 Minutes Intravenous Every 6 hours 08/05/22 1503 08/06/22 0013   08/05/22 0915  ceFAZolin (ANCEF) IVPB 2g/100 mL premix        2 g 200 mL/hr over 30 Minutes Intravenous On call to O.R. 08/05/22 0910 08/05/22 1222        Patient was given sequential compression devices, early ambulation, and chemoprophylaxis to prevent DVT.  Patient benefited maximally from hospital stay and there were no complications.    Recent vital signs: Patient Vitals for the past 24 hrs:  BP Temp Temp src Pulse Resp SpO2  08/06/22 1026 132/75 98.3 F (36.8 C) Oral (!) 115 20 98 %  08/06/22 0740 110/80 98 F (36.7 C) Oral (!) 101 20 96 %  08/06/22 0443 132/73 97.8 F (36.6 C) Oral 98 16 96 %   08/06/22 0133 119/76 98.5 F (36.9 C) Oral 91 14 97 %  08/05/22 2102 132/77 97.7 F (36.5 C) Oral 100 12 98 %  08/05/22 1502 (!) 138/92 97.8 F (36.6 C) Oral 63 16 100 %  08/05/22 1430 (!) 141/91 -- -- (!) 59 10 99 %  08/05/22 1415 138/87 -- -- 73 14 97 %  08/05/22 1400 (!) 139/90 -- -- 72 (!) 24 100 %  08/05/22 1345 126/83 -- -- 83 19 100 %  08/05/22 1338 117/73 97.7 F (36.5 C) -- 71 (!) 9 100 %     Recent laboratory studies:  Recent Labs    08/06/22 0333  WBC 12.8*  HGB 10.9*  HCT 32.7*  PLT 207  NA 137  K 4.5  CL 106  CO2 25  BUN 22*  CREATININE 0.87  GLUCOSE 129*  CALCIUM 8.3*     Discharge Medications:   Allergies as of 08/06/2022   No Known Allergies      Medication List     STOP taking these medications    acetaminophen 500 MG tablet Commonly known as: TYLENOL   moxifloxacin 0.5 % ophthalmic solution Commonly known as: VIGAMOX   oxyCODONE 5 MG immediate release tablet Commonly known as: Oxy IR/ROXICODONE       TAKE these medications    aspirin 81 MG chewable tablet Chew 1 tablet (81 mg total) by mouth 2 (two) times daily.   cetirizine 10 MG tablet Commonly  known as: ZYRTEC Take 10 mg by mouth daily.   HYDROmorphone 2 MG tablet Commonly known as: DILAUDID Take 1 tablet (2 mg total) by mouth every 4 (four) hours as needed for severe pain (pain score 7-10).   levothyroxine 175 MCG tablet Commonly known as: SYNTHROID TAKE 1 TABLET BY MOUTH DAILY BEFORE BREAKFAST.   losartan-hydrochlorothiazide 50-12.5 MG tablet Commonly known as: HYZAAR TAKE 1 TABLET BY MOUTH EVERY DAY   methocarbamol 500 MG tablet Commonly known as: ROBAXIN Take 1 tablet (500 mg total) by mouth every 6 (six) hours as needed for muscle spasms.   multivitamin with minerals Tabs tablet Take 1 tablet by mouth daily.   prednisoLONE acetate 1 % ophthalmic suspension Commonly known as: PRED FORTE Place 1 drop into the right eye 2 (two) times daily.   tadalafil 20  MG tablet Commonly known as: CIALIS Take 0.5-1 tablets (10-20 mg total) by mouth every other day as needed for erectile dysfunction.   trimethoprim-polymyxin b ophthalmic solution Commonly known as: POLYTRIM Place 1 drop into the right eye 2 (two) times daily.               Durable Medical Equipment  (From admission, onward)           Start     Ordered   08/05/22 1504  DME Walker rolling  Once       Question Answer Comment  Walker: With 5 Inch Wheels   Patient needs a walker to treat with the following condition Status post total replacement of left hip      08/05/22 1503   08/05/22 1504  DME 3 n 1  Once        08/05/22 1503            Diagnostic Studies: DG HIP UNILAT WITH PELVIS 1V LEFT  Result Date: 08/05/2022 CLINICAL DATA:  Left hip arthroplasty. EXAM: DG HIP (WITH OR WITHOUT PELVIS) 1V*L* COMPARISON:  Preoperative imaging. FINDINGS: Two fluoroscopic spot views of the pelvis and left hip obtained in the operating room. Left hip arthroplasty in place. Fluoroscopy time 20 seconds. Dose 6.5 mGy. IMPRESSION: Intraoperative fluoroscopy for left hip arthroplasty. Electronically Signed   By: Keith Rake M.D.   On: 08/05/2022 14:15   DG Pelvis Portable  Result Date: 08/05/2022 CLINICAL DATA:  Status post left hip arthroplasty. EXAM: PORTABLE PELVIS 1-2 VIEWS COMPARISON:  Preoperative radiograph 04/21/2022 FINDINGS: Left hip arthroplasty in expected alignment. No periprosthetic lucency or fracture. Recent postsurgical change includes air and edema in the soft tissues. Prior right hip arthroplasty is intact. IMPRESSION: Left hip arthroplasty without immediate postoperative complication. Electronically Signed   By: Keith Rake M.D.   On: 08/05/2022 14:14   DG C-Arm 1-60 Min-No Report  Result Date: 08/05/2022 Fluoroscopy was utilized by the requesting physician.  No radiographic interpretation.   DG C-Arm 1-60 Min-No Report  Result Date:  08/05/2022 Fluoroscopy was utilized by the requesting physician.  No radiographic interpretation.   XR HIP UNILAT W OR W/O PELVIS 1V RIGHT  Result Date: 07/07/2022 An AP pelvis and lateral the right hip shows a well-seated total hip arthroplasty on the right side with no complicating features.  The left hip shows severe end-stage arthritis with bone-on-bone wear and complete loss of the joint space.  There is flattening of the femoral head as well as some cystic changes, sclerotic changes and osteophytes.   Disposition: Discharge disposition: 06-Home-Health Care Svc          Follow-up Information  Mcarthur Rossetti, MD Follow up in 2 week(s).   Specialty: Orthopedic Surgery Contact information: [address] [place] [address] [phone]                  Signed: Erskine Emery 08/06/2022, 11:30 AM

## 2022-08-08 ENCOUNTER — Encounter (HOSPITAL_COMMUNITY): Payer: Self-pay | Admitting: Orthopaedic Surgery

## 2022-08-10 ENCOUNTER — Telehealth: Payer: Self-pay | Admitting: Orthopaedic Surgery

## 2022-08-10 NOTE — Telephone Encounter (Signed)
Patient called. Says he has not heard anything from home PT agency. Would like to know if he will get home PT? His call back number is [phone]

## 2022-08-15 ENCOUNTER — Telehealth: Payer: Self-pay | Admitting: Orthopaedic Surgery

## 2022-08-15 NOTE — Telephone Encounter (Signed)
Called and gave verbal ok 

## 2022-08-15 NOTE — Telephone Encounter (Signed)
Victor Ford (PT) from [facility] called requesting verbal orders for home health pt of 3 wk 1, 2wk 2. Please call Victor Ford back at [phone]. If unable to answer leave vm with detailed message to secure phone number.

## 2022-08-18 ENCOUNTER — Ambulatory Visit (INDEPENDENT_AMBULATORY_CARE_PROVIDER_SITE_OTHER): Payer: 59 | Admitting: Orthopaedic Surgery

## 2022-08-18 ENCOUNTER — Encounter: Payer: Self-pay | Admitting: Orthopaedic Surgery

## 2022-08-18 DIAGNOSIS — Z96641 Presence of right artificial hip joint: Secondary | ICD-10-CM

## 2022-08-18 DIAGNOSIS — Z96642 Presence of left artificial hip joint: Secondary | ICD-10-CM

## 2022-08-18 NOTE — Progress Notes (Signed)
Victor Ford comes in today 2 weeks after a left total hip arthroplasty.  We actually replaced his right hip in June.  He is doing well overall.  He has been compliant with a baby aspirin twice a day and only taking Tylenol for pain.  His staples were removed from the left hip incision everything looks good.  He does have significant bruising and swelling on his left lower extremity but his knee is stable and his calf is soft.  He can flex and extend at the ankle.  He has been walking with a cane and doing well.  He will continue to increase his activities as comfort allows.  We will see him back in a month to see how he is doing overall but no x-rays are needed.  He can even start work from my standpoint when he is ready.

## 2022-08-25 NOTE — Telephone Encounter (Signed)
Received call from [facility] (PT) with [facility] asked is she can move patient's discharge date from next week to tomorrow? Raquel Sarna said patient would like to return back to work next week.  Raquel Sarna said if she can not answer her phone to please leave a message on her secure voicemail.  The number to contact Raquel Sarna is [phone]

## 2022-08-25 NOTE — Telephone Encounter (Signed)
Verbal ok given.

## 2022-09-14 ENCOUNTER — Encounter: Payer: Self-pay | Admitting: Orthopaedic Surgery

## 2022-09-14 ENCOUNTER — Ambulatory Visit (INDEPENDENT_AMBULATORY_CARE_PROVIDER_SITE_OTHER): Payer: 59 | Admitting: Orthopaedic Surgery

## 2022-09-14 DIAGNOSIS — Z96641 Presence of right artificial hip joint: Secondary | ICD-10-CM

## 2022-09-14 DIAGNOSIS — Z96642 Presence of left artificial hip joint: Secondary | ICD-10-CM

## 2022-09-14 NOTE — Progress Notes (Signed)
The patient is now 6 weeks status post a left hip replacement in several months status post a right hip replacement.  He is only 51 years old.  He is doing excellent.  He is walking without a limp forward assistive device.  He even showed me that he could squat in the office here.  Both hips move smoothly and fluidly.  His leg lengths are equal.  He will increase his activities as comfort allows.  Will see him back in 6 months with a standing low AP pelvis.  If there are issues before then he knows to let us know.

## 2022-11-07 ENCOUNTER — Encounter: Payer: Self-pay | Admitting: Gastroenterology

## 2022-11-23 ENCOUNTER — Other Ambulatory Visit: Payer: Self-pay | Admitting: Internal Medicine

## 2022-12-09 ENCOUNTER — Other Ambulatory Visit: Payer: Self-pay | Admitting: Internal Medicine

## 2022-12-29 ENCOUNTER — Encounter: Payer: Self-pay | Admitting: Radiology

## 2023-01-04 ENCOUNTER — Other Ambulatory Visit: Payer: Self-pay | Admitting: Internal Medicine

## 2023-01-20 ENCOUNTER — Encounter: Payer: 59 | Admitting: Internal Medicine

## 2023-01-26 ENCOUNTER — Encounter: Payer: 59 | Admitting: Internal Medicine

## 2023-02-21 ENCOUNTER — Other Ambulatory Visit: Payer: Self-pay | Admitting: Internal Medicine

## 2023-02-22 NOTE — Telephone Encounter (Signed)
Pt needs to reschedule his CPE. 90 days of medication was sent. Please help him get scheduled. Thanks.

## 2023-02-22 NOTE — Telephone Encounter (Signed)
LVM to cb and r/s

## 2023-03-02 ENCOUNTER — Other Ambulatory Visit: Payer: Self-pay | Admitting: Internal Medicine

## 2023-03-02 NOTE — Telephone Encounter (Signed)
Pt needs to schedule CPE. Thanks.

## 2023-03-15 ENCOUNTER — Encounter: Payer: Self-pay | Admitting: Orthopaedic Surgery

## 2023-03-15 ENCOUNTER — Other Ambulatory Visit (INDEPENDENT_AMBULATORY_CARE_PROVIDER_SITE_OTHER): Payer: BC Managed Care – PPO

## 2023-03-15 ENCOUNTER — Ambulatory Visit (INDEPENDENT_AMBULATORY_CARE_PROVIDER_SITE_OTHER): Payer: BC Managed Care – PPO | Admitting: Orthopaedic Surgery

## 2023-03-15 DIAGNOSIS — Z96642 Presence of left artificial hip joint: Secondary | ICD-10-CM

## 2023-03-15 NOTE — Progress Notes (Signed)
Victor Ford comes in today doing great.  We replaced his left hip in October of this past year and his right hip in June of last year.  He has no issues at all.  He is walking with a normal gait and is very happy.  On exam both hips move smoothly and fluidly.  His leg lengths are equal.  X-rays of the pelvis and hip show well-seated implants with no complicating features at all.  At this point follow-up can be as needed for his hips.  All questions concerns were answered and addressed.  If he does develop any issues at all he knows to let us know.

## 2023-05-04 ENCOUNTER — Ambulatory Visit (INDEPENDENT_AMBULATORY_CARE_PROVIDER_SITE_OTHER): Payer: BC Managed Care – PPO | Admitting: Internal Medicine

## 2023-05-04 ENCOUNTER — Encounter: Payer: Self-pay | Admitting: Internal Medicine

## 2023-05-04 VITALS — BP 136/88 | HR 79 | Temp 98.2°F | Ht 68.0 in | Wt 260.0 lb

## 2023-05-04 DIAGNOSIS — I10 Essential (primary) hypertension: Secondary | ICD-10-CM | POA: Diagnosis not present

## 2023-05-04 DIAGNOSIS — Z0000 Encounter for general adult medical examination without abnormal findings: Secondary | ICD-10-CM | POA: Diagnosis not present

## 2023-05-04 DIAGNOSIS — E039 Hypothyroidism, unspecified: Secondary | ICD-10-CM

## 2023-05-04 MED ORDER — LEVOTHYROXINE SODIUM 175 MCG PO TABS: 175.0000 ug | ORAL_TABLET | Freq: Every day | ORAL | 3 refills | Status: DC

## 2023-05-04 MED ORDER — LOSARTAN POTASSIUM-HCTZ 50-12.5 MG PO TABS: 1.0000 | ORAL_TABLET | Freq: Every day | ORAL | 3 refills | Status: DC

## 2023-05-04 MED ORDER — TADALAFIL 20 MG PO TABS: 20.0000 mg | ORAL_TABLET | Freq: Every day | ORAL | 1 refills | Status: DC | PRN

## 2023-05-04 NOTE — Assessment & Plan Note (Signed)
BP Readings from Last 3 Encounters:  05/04/23 136/88  08/06/22 132/75  07/26/22 (!) 152/90   Doing well on losartant/hydrochlorothiazide 50/12.5 Will check labs

## 2023-05-04 NOTE — Assessment & Plan Note (Signed)
Seems euthyroid on levothyroxine  175 mcg daily

## 2023-05-04 NOTE — Patient Instructions (Signed)
Please set up your screening colonoscopy 

## 2023-05-04 NOTE — Progress Notes (Signed)
Subjective:    Patient ID: Victor Ford, male    DOB: 01/21/1971, 52 y.o.   MRN: [id number]  HPI Here for physical  Has both hip replaced Did very well Is trying to be more active  No new health concerns  Current Outpatient Medications on File Prior to Visit  Medication Sig Dispense Refill   aspirin 81 MG chewable tablet Chew 1 tablet (81 mg total) by mouth 2 (two) times daily. 35 tablet 0   cetirizine (ZYRTEC) 10 MG tablet Take 10 mg by mouth daily.     levothyroxine (SYNTHROID) 175 MCG tablet TAKE ONE TABLET BY MOUTH EVERY MORNING BEFORE BREAKFAST AS DIRECTED 90 tablet 0   losartan-hydrochlorothiazide (HYZAAR) 50-12.5 MG tablet TAKE ONE TABLET BY MOUTH ONE TIME DAILY 330 tablet 0   Multiple Vitamin (MULTIVITAMIN WITH MINERALS) TABS tablet Take 1 tablet by mouth daily.     tadalafil (CIALIS) 20 MG tablet TAKE ONE-HALF TO ONE TABLET BY MOUTH EVERY OTHER DAY AS NEEDED FOR ERECTILE DYSFUNCTION 10 tablet 1   No current facility-administered medications on file prior to visit.    No Known Allergies  Past Medical History:  Diagnosis Date   Allergy    Arthritis    Hyperlipidemia    diet controlled/no meds   Hypertension    Hypothyroidism     Past Surgical History:  Procedure Laterality Date   COLONOSCOPY  2020   FINGER FRACTURE SURGERY     1980's Right 2nd/3rd finger repair   TOTAL HIP ARTHROPLASTY Right 04/01/2022   Procedure: RIGHT TOTAL HIP ARTHROPLASTY ANTERIOR APPROACH;  Surgeon: Kathryne Hitch, MD;  Location: [facility];  Service: Orthopedics;  Laterality: Right;   TOTAL HIP ARTHROPLASTY Left 08/05/2022   Procedure: LEFT TOTAL HIP ARTHROPLASTY ANTERIOR APPROACH;  Surgeon: Kathryne Hitch, MD;  Location: [facility];  Service: Orthopedics;  Laterality: Left;    Family History  Problem Relation Age of Onset   Alcohol abuse Father    Hypertension Father    Dementia Paternal Uncle    Diabetes Neg Hx    Cancer Neg Hx    Heart disease Neg Hx    Colon  cancer Neg Hx    Rectal cancer Neg Hx    Stomach cancer Neg Hx     Social History   Socioeconomic History   Marital status: Married    Spouse name: Not on file   Number of children: 1   Years of education: Not on file   Highest education level: Not on file  Occupational History   Occupation: Chartered certified accountant    Comment: [employer]  Tobacco Use   Smoking status: Former    Current packs/day: 0.00    Average packs/day: 1 pack/day for 15.0 years (15.0 ttl pk-yrs)    Types: Cigarettes    Start date: 57    Quit date: 2002    Years since quitting: 22.5    Passive exposure: Never   Smokeless tobacco: Never  Vaping Use   Vaping status: Never Used  Substance and Sexual Activity   Alcohol use: Yes    Alcohol/week: 6.0 standard drinks of alcohol    Types: 6 Standard drinks or equivalent per week   Drug use: No   Sexual activity: Yes  Other Topics Concern   Not on file  Social History Narrative   Not on file   Social Determinants of Health   Financial Resource Strain: Not on file  Food Insecurity: No Food Insecurity (08/05/2022)   Hunger Vital  Sign    Worried About Programme researcher, broadcasting/film/video in the Last Year: Never true    Ran Out of Food in the Last Year: Never true  Transportation Needs: No Transportation Needs (08/05/2022)   PRAPARE - Administrator, Civil Service (Medical): No    Lack of Transportation (Non-Medical): No  Physical Activity: Not on file  Stress: Not on file  Social Connections: Not on file  Intimate Partner Violence: Not At Risk (08/05/2022)   Humiliation, Afraid, Rape, and Kick questionnaire    Fear of Current or Ex-Partner: No    Emotionally Abused: No    Physically Abused: No    Sexually Abused: No   Review of Systems  Constitutional:  Negative for fatigue and unexpected weight change.       Wears seat belt  HENT:  Negative for dental problem, hearing loss and trouble swallowing.        Keeps up with dentist  Eyes:  Negative for visual  disturbance.       No diplopia or unilateral vision loss  Respiratory:  Negative for cough, chest tightness and shortness of breath.   Cardiovascular:  Negative for chest pain, palpitations and leg swelling.  Gastrointestinal:  Negative for blood in stool and constipation.       No heartburn  Endocrine: Negative for polydipsia and polyuria.  Genitourinary:  Negative for difficulty urinating and urgency.       Satisfied with cialis  Musculoskeletal:  Negative for arthralgias, back pain and joint swelling.  Skin:        Some leg rashes--tee tree oil helps No itching or pain  Allergic/Immunologic: Positive for environmental allergies. Negative for immunocompromised state.       Uses cetirizine  Neurological:  Negative for dizziness, syncope, light-headedness and headaches.  Hematological:  Negative for adenopathy. Does not bruise/bleed easily.  Psychiatric/Behavioral:  Negative for dysphoric mood and sleep disturbance. The patient is not nervous/anxious.        Objective:   Physical Exam Constitutional:      Appearance: Normal appearance.  HENT:     Mouth/Throat:     Pharynx: No oropharyngeal exudate or posterior oropharyngeal erythema.  Eyes:     Conjunctiva/sclera: Conjunctivae normal.     Pupils: Pupils are equal, round, and reactive to light.  Cardiovascular:     Rate and Rhythm: Normal rate and regular rhythm.     Pulses: Normal pulses.     Heart sounds: No murmur heard.    No gallop.  Pulmonary:     Effort: Pulmonary effort is normal.     Breath sounds: Normal breath sounds. No wheezing or rales.  Abdominal:     Palpations: Abdomen is soft.     Tenderness: There is no abdominal tenderness.  Musculoskeletal:     Cervical back: Neck supple.     Right lower leg: No edema.     Left lower leg: No edema.  Lymphadenopathy:     Cervical: No cervical adenopathy.  Skin:    Findings: No lesion.     Comments: Slight reddish discoloration in distal calves (early stasis  changes?)  Neurological:     General: No focal deficit present.     Mental Status: He is alert and oriented to person, place, and time.  Psychiatric:        Mood and Affect: Mood normal.        Behavior: Behavior normal.            Assessment &  Plan:

## 2023-05-04 NOTE — Assessment & Plan Note (Signed)
Healthy Working on fitness now Prefers no shingrix or COVID vaccines Will consider flu vaccine in the fall Consider PSA next year Overdue for colon---he will set it up

## 2023-05-05 LAB — COMPREHENSIVE METABOLIC PANEL
ALT: 27 U/L (ref 0–53)
AST: 28 U/L (ref 0–37)
Albumin: 4.4 g/dL (ref 3.5–5.2)
Alkaline Phosphatase: 60 U/L (ref 39–117)
BUN: 16 mg/dL (ref 6–23)
CO2: 31 mEq/L (ref 19–32)
Calcium: 9.5 mg/dL (ref 8.4–10.5)
Chloride: 102 mEq/L (ref 96–112)
Creatinine, Ser: 0.88 mg/dL (ref 0.40–1.50)
GFR: 97.66 mL/min (ref >60.00)
Glucose, Bld: 94 mg/dL (ref 70–99)
Potassium: 4.4 mEq/L (ref 3.5–5.1)
Sodium: 141 mEq/L (ref 135–145)
Total Bilirubin: 0.6 mg/dL (ref 0.2–1.2)
Total Protein: 7 g/dL (ref 6.0–8.3)

## 2023-05-05 LAB — LIPID PANEL
Cholesterol: 225 mg/dL — ABNORMAL HIGH (ref 0–200)
HDL: 71.8 mg/dL (ref >39.00)
LDL Cholesterol: 130 mg/dL — ABNORMAL HIGH (ref 0–99)
NonHDL: 153.39
Total CHOL/HDL Ratio: 3
Triglycerides: 117 mg/dL (ref 0.0–149.0)
VLDL: 23.4 mg/dL (ref 0.0–40.0)

## 2023-05-05 LAB — CBC
HCT: 43 % (ref 39.0–52.0)
Hemoglobin: 14.3 g/dL (ref 13.0–17.0)
MCHC: 33.2 g/dL (ref 30.0–36.0)
MCV: 89.5 fl (ref 78.0–100.0)
Platelets: 255 10*3/uL (ref 150.0–400.0)
RBC: 4.8 Mil/uL (ref 4.22–5.81)
RDW: 13.8 % (ref 11.5–15.5)
WBC: 6.7 10*3/uL (ref 4.0–10.5)

## 2023-05-05 LAB — T4, FREE: Free T4: 0.92 ng/dL (ref 0.60–1.60)

## 2023-05-05 LAB — TSH: TSH: 3.26 u[IU]/mL (ref 0.35–5.50)

## 2023-05-06 ENCOUNTER — Other Ambulatory Visit: Payer: Self-pay | Admitting: Internal Medicine

## 2023-05-13 ENCOUNTER — Other Ambulatory Visit (HOSPITAL_COMMUNITY): Payer: Self-pay

## 2023-05-13 ENCOUNTER — Telehealth: Payer: Self-pay

## 2023-05-13 NOTE — Telephone Encounter (Signed)
Pharmacy Patient Advocate Encounter   Received notification from CoverMyMeds that prior authorization for Tadalafil 20MG  tablets is required/requested.   Insurance verification completed.   The patient is insured through [employer] .   Per test claim: PA submitted to [employer] via CoverMyMeds Key/confirmation #/EOC [id number] Status is pending

## 2023-05-16 ENCOUNTER — Other Ambulatory Visit (HOSPITAL_COMMUNITY): Payer: Self-pay

## 2023-05-16 ENCOUNTER — Other Ambulatory Visit: Payer: Self-pay | Admitting: Internal Medicine

## 2023-05-16 NOTE — Telephone Encounter (Signed)
Pharmacy Patient Advocate Encounter  Received notification from [employer] that Prior Authorization for Tadalafil 20mg  has been APPROVED from 04/13/23 to 05/12/24.Marland Kitchen  PA #/Case ID/Reference #: [id number]  Spoke with Pharmacy to process. Copay is $91.30 due deductible at [facility]. Insurance allows a max of 8 per 30 days.   Per pharmacy, the patient has filled and picked up script 05/07/23.  Approval letter indexed to chart.

## 2023-06-01 DIAGNOSIS — H52213 Irregular astigmatism, bilateral: Secondary | ICD-10-CM | POA: Diagnosis not present

## 2023-06-01 DIAGNOSIS — H18603 Keratoconus, unspecified, bilateral: Secondary | ICD-10-CM | POA: Diagnosis not present

## 2023-06-01 DIAGNOSIS — Z0100 Encounter for examination of eyes and vision without abnormal findings: Secondary | ICD-10-CM | POA: Diagnosis not present

## 2023-06-03 ENCOUNTER — Other Ambulatory Visit: Payer: Self-pay | Admitting: Internal Medicine

## 2023-10-31 IMAGING — RF DG HIP (WITH OR WITHOUT PELVIS) 1V*R*
1 series · 6 of 6 positions shown · non-contrast
Comparison: 01/31/2022

CLINICAL DATA: Fluoroscopic assistance for right hip arthroplasty

EXAM:
DG HIP (WITH OR WITHOUT PELVIS) 1V RIGHT

[Series 1: unknown protocol · 0.20mm/px · 6 of 6 slices shown]
[im 1/6]
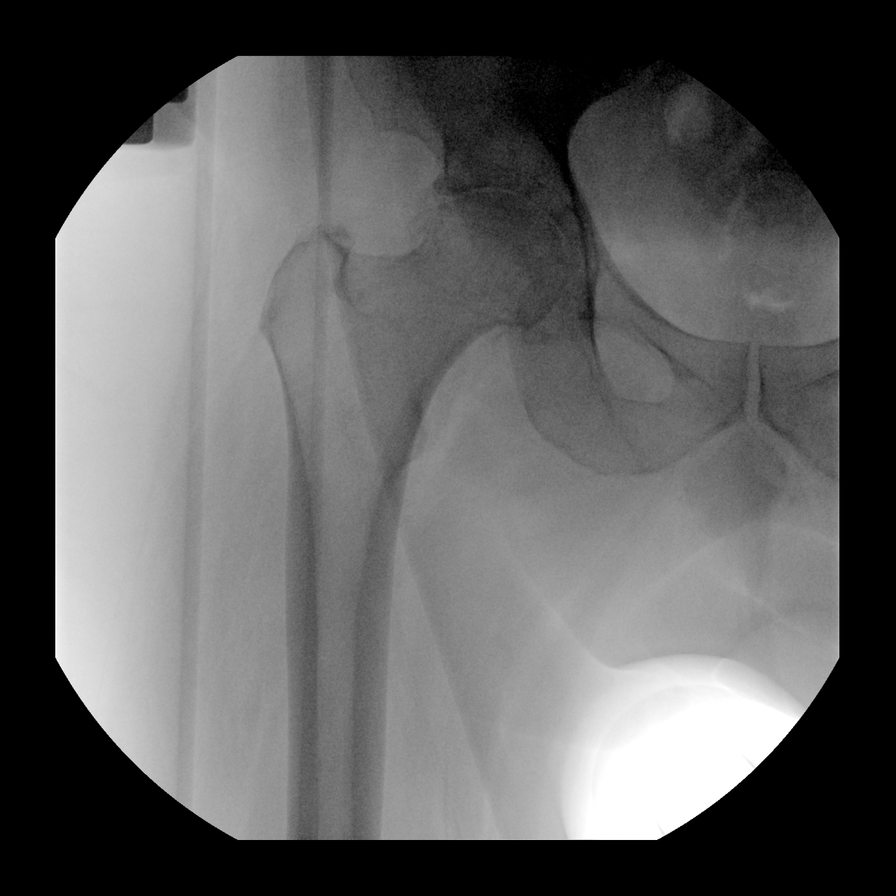
[im 2/6]
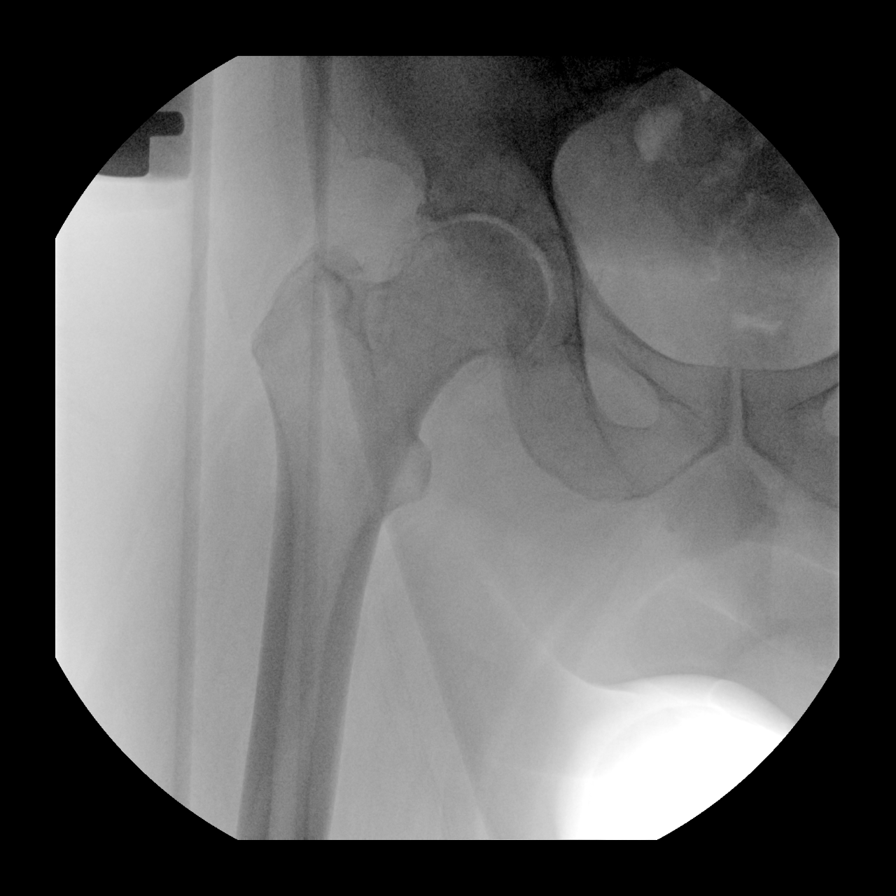
[im 3/6]
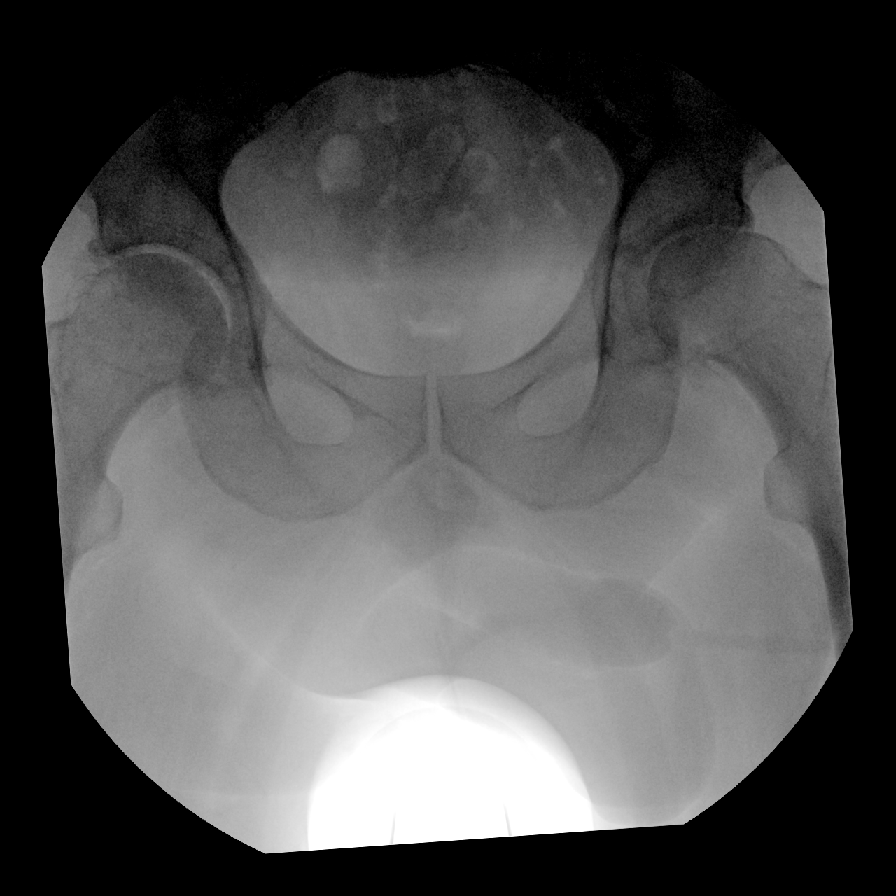
[im 4/6]
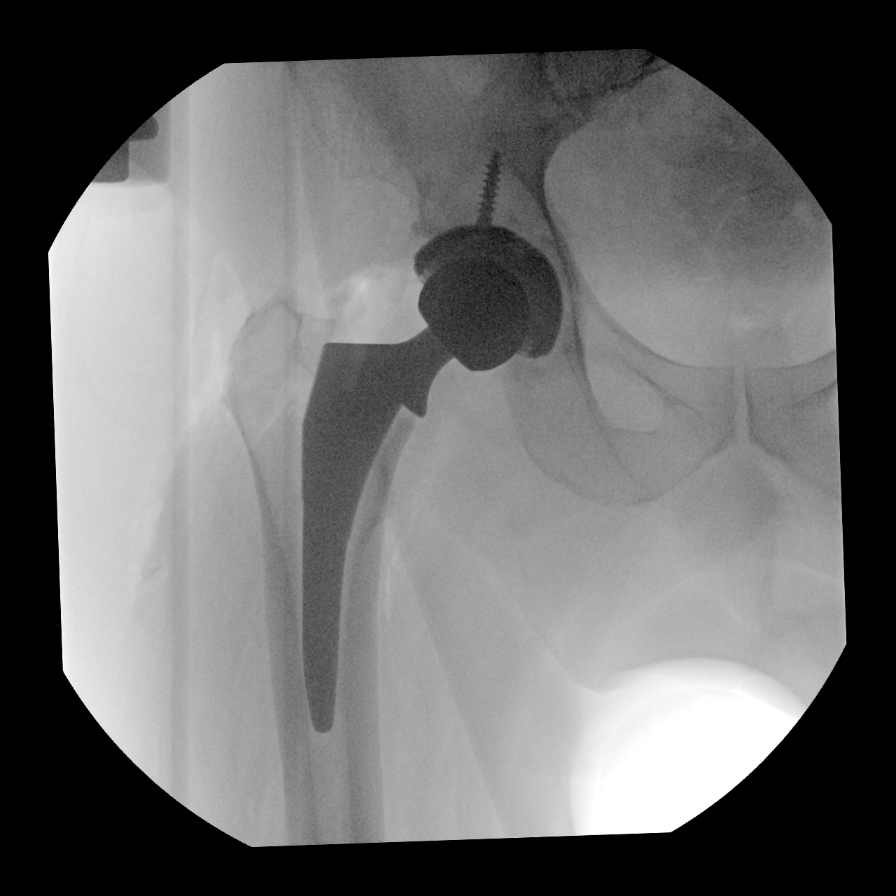
[im 5/6]
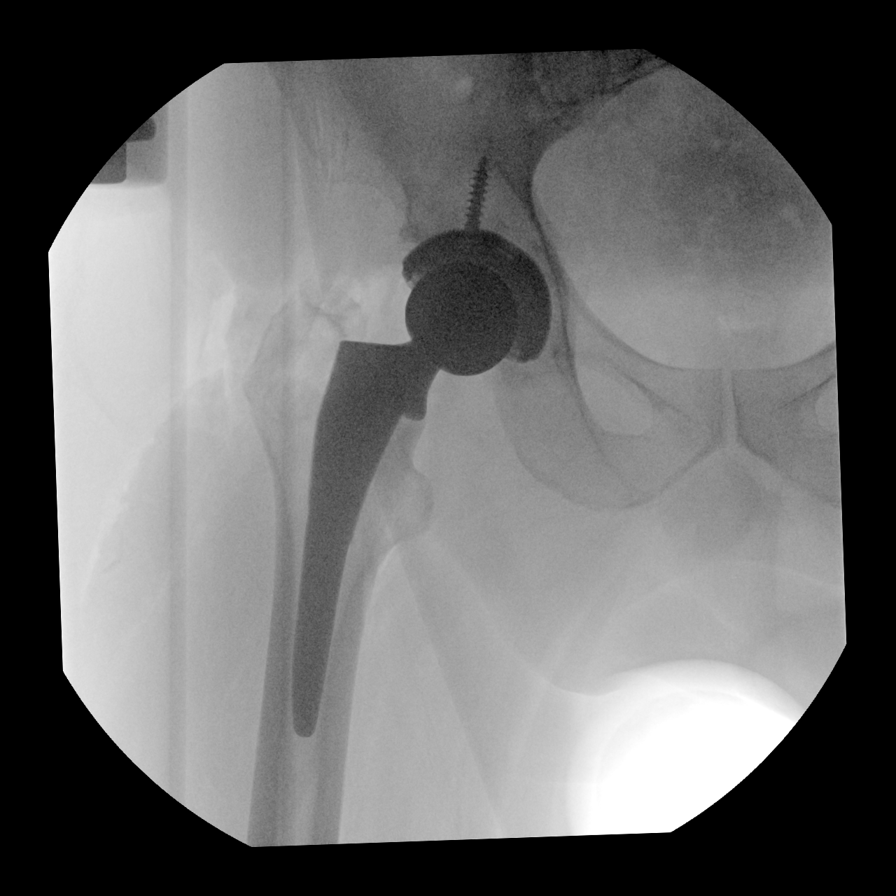
[im 6/6]
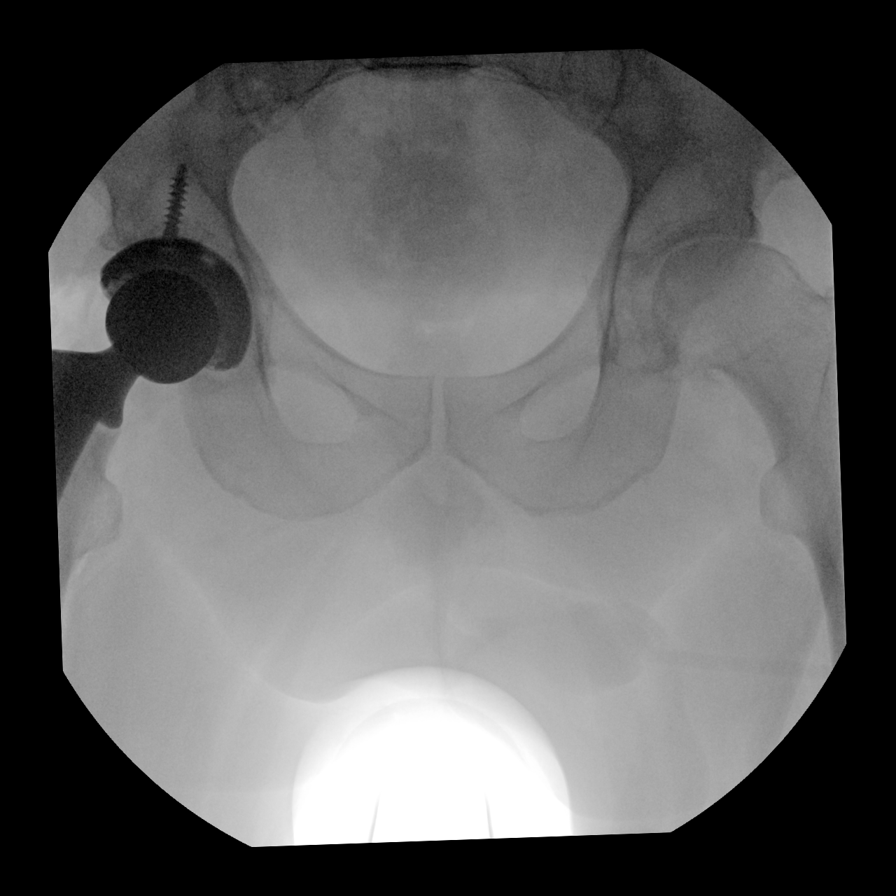

[6 of 6 positions shown; findings below may reference images not displayed]

FINDINGS: Fluoroscopic images show right hip arthroplasty. Estimated radiation
dose is 3.18 mGy.
IMPRESSION: Fluoroscopic assistance was provided for right hip arthroplasty.

## 2023-10-31 IMAGING — DX DG PORTABLE PELVIS
1 series · 1 of 1 positions shown · non-contrast
Comparison: None Available.

CLINICAL DATA: Status post right hip arthroplasty.

EXAM:
PORTABLE PELVIS 1-2 VIEWS

[pelvis ap]
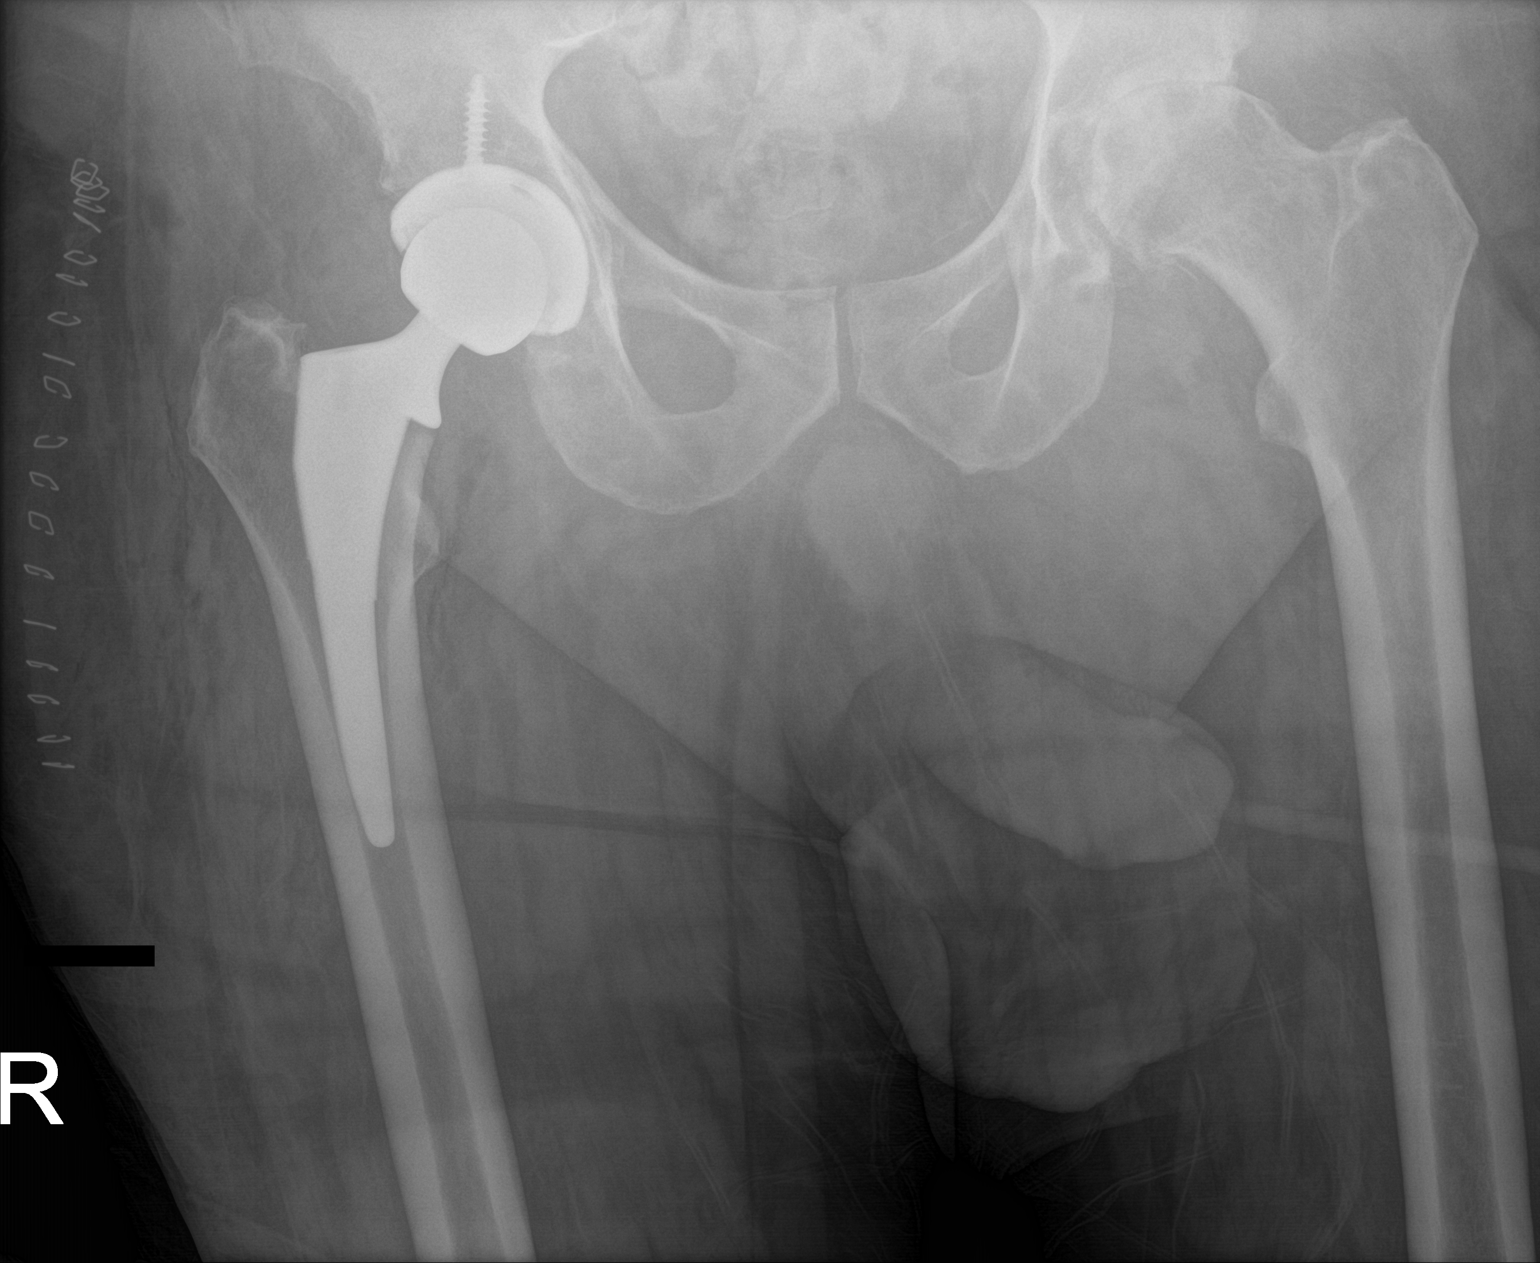

[1 of 1 positions shown; findings below may reference images not displayed]

FINDINGS: Postsurgical change of right hip arthroplasty without evidence of
acute hardware complication. Severe left hip degenerative change.
IMPRESSION: Postsurgical change of right hip arthroplasty without evidence of
acute hardware complication.

## 2024-03-12 ENCOUNTER — Other Ambulatory Visit: Payer: Self-pay | Admitting: Internal Medicine

## 2024-03-13 NOTE — Telephone Encounter (Signed)
 Please call our office to reschedule your physical. Dr Joelle Musca will be out that day. Sorry for the inconvenience. This is the third and final message. Please call our office as soon as possible.Thank you.

## 2024-04-20 ENCOUNTER — Other Ambulatory Visit: Payer: Self-pay | Admitting: Internal Medicine

## 2024-05-06 ENCOUNTER — Other Ambulatory Visit: Payer: Self-pay | Admitting: Internal Medicine

## 2024-05-09 ENCOUNTER — Encounter: Payer: BC Managed Care – PPO | Admitting: Internal Medicine

## 2024-05-20 ENCOUNTER — Ambulatory Visit (INDEPENDENT_AMBULATORY_CARE_PROVIDER_SITE_OTHER): Payer: Self-pay | Admitting: Internal Medicine

## 2024-05-20 ENCOUNTER — Encounter: Payer: Self-pay | Admitting: Internal Medicine

## 2024-05-20 VITALS — BP 138/88 | HR 74 | Temp 98.0°F | Ht 67.75 in | Wt 267.0 lb

## 2024-05-20 DIAGNOSIS — Z1211 Encounter for screening for malignant neoplasm of colon: Secondary | ICD-10-CM

## 2024-05-20 DIAGNOSIS — I10 Essential (primary) hypertension: Secondary | ICD-10-CM | POA: Diagnosis not present

## 2024-05-20 DIAGNOSIS — Z0000 Encounter for general adult medical examination without abnormal findings: Secondary | ICD-10-CM | POA: Diagnosis not present

## 2024-05-20 DIAGNOSIS — E039 Hypothyroidism, unspecified: Secondary | ICD-10-CM

## 2024-05-20 DIAGNOSIS — Z125 Encounter for screening for malignant neoplasm of prostate: Secondary | ICD-10-CM

## 2024-05-20 NOTE — Assessment & Plan Note (Signed)
 Healthy but needs to work on fitness Prefers no COVID or shingrix vaccines Flu vaccine in the fall Overdue for colon Will check PSA

## 2024-05-20 NOTE — Assessment & Plan Note (Signed)
 Seems euthyroid on levothyroxine  175

## 2024-05-20 NOTE — Progress Notes (Signed)
 Subjective:    Patient ID: Victor Ford, male    DOB: 1971-07-19, 53 y.o.   MRN: [id number]  HPI Here for physical  Doing okay Has changed jobs---on his feet all day Weight up slightly---discussed Discussed healthy eating  Current Outpatient Medications on File Prior to Visit  Medication Sig Dispense Refill   cetirizine (ZYRTEC) 10 MG tablet Take 10 mg by mouth daily.     levothyroxine  (SYNTHROID ) 175 MCG tablet TAKE ONE TABLET BY MOUTH EVERY MORNING BEFORE BREAKFAST AS DIRECTED 90 tablet 3   losartan -hydrochlorothiazide  (HYZAAR) 50-12.5 MG tablet TAKE ONE TABLET BY MOUTH ONE TIME DAILY 90 tablet 0   tadalafil  (CIALIS ) 20 MG tablet TAKE ONE TABLET BY MOUTH ONE TIME DAILY AS NEEDED FOR ERECTILE DYSFUNCTION 10 tablet 0   Multiple Vitamin (MULTIVITAMIN WITH MINERALS) TABS tablet Take 1 tablet by mouth daily.     No current facility-administered medications on file prior to visit.    No Known Allergies  Past Medical History:  Diagnosis Date   Allergy    Arthritis    Hyperlipidemia    diet controlled/no meds   Hypertension    Hypothyroidism     Past Surgical History:  Procedure Laterality Date   COLONOSCOPY  2020   FINGER FRACTURE SURGERY     1980's Right 2nd/3rd finger repair   TOTAL HIP ARTHROPLASTY Right 04/01/2022   Procedure: RIGHT TOTAL HIP ARTHROPLASTY ANTERIOR APPROACH;  Surgeon: Vernetta Lonni GRADE, MD;  Location: [facility];  Service: Orthopedics;  Laterality: Right;   TOTAL HIP ARTHROPLASTY Left 08/05/2022   Procedure: LEFT TOTAL HIP ARTHROPLASTY ANTERIOR APPROACH;  Surgeon: Vernetta Lonni GRADE, MD;  Location: [facility];  Service: Orthopedics;  Laterality: Left;    Family History  Problem Relation Age of Onset   Alcohol abuse Father    Hypertension Father    Dementia Paternal Uncle    Diabetes Neg Hx    Cancer Neg Hx    Heart disease Neg Hx    Colon cancer Neg Hx    Rectal cancer Neg Hx    Stomach cancer Neg Hx     Social History   Socioeconomic  History   Marital status: Married    Spouse name: Not on file   Number of children: 1   Years of education: Not on file   Highest education level: Not on file  Occupational History   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  Tobacco Use   Smoking status: Former    Current packs/day: 0.00    Average packs/day: 1 pack/day for 15.0 years (15.0 ttl pk-yrs)    Types: Cigarettes    Start date: 19    Quit date: 2002    Years since quitting: 23.5    Passive exposure: Never   Smokeless tobacco: Never  Vaping Use   Vaping status: Never Used  Substance and Sexual Activity   Alcohol use: Yes    Alcohol/week: 6.0 standard drinks of alcohol    Types: 6 Standard drinks or equivalent per week   Drug use: No   Sexual activity: Yes  Other Topics Concern   Not on file  Social History Narrative   Not on file   Social Drivers of Health   Financial Resource Strain: Not on file  Food Insecurity: No Food Insecurity (08/05/2022)   Hunger Vital Sign    Worried About Running Out of Food in the Last Year: Never true    Ran Out of Food in the Last Year: Never true  Transportation  Needs: No Transportation Needs (08/05/2022)   PRAPARE - Administrator, Civil Service (Medical): No    Lack of Transportation (Non-Medical): No  Physical Activity: Not on file  Stress: Not on file  Social Connections: Not on file  Intimate Partner Violence: Not At Risk (08/05/2022)   Humiliation, Afraid, Rape, and Kick questionnaire    Fear of Current or Ex-Partner: No    Emotionally Abused: No    Physically Abused: No    Sexually Abused: No   Review of Systems  Constitutional:  Negative for fatigue.       Wears seat belt  HENT:  Negative for dental problem, hearing loss and tinnitus.        Keeps up with dentist  Eyes:  Negative for visual disturbance.       No diplopia or unilateral vision loss  Respiratory:  Negative for cough, chest tightness and shortness of breath.   Cardiovascular:  Negative  for chest pain, palpitations and leg swelling.  Gastrointestinal:  Negative for blood in stool and constipation.       No heartburn  Endocrine: Negative for polydipsia and polyuria.  Genitourinary:  Negative for difficulty urinating and urgency.       Satisfied with cialis   Musculoskeletal:  Negative for back pain and joint swelling.       Has pinched nerve in right neck--to shoulder/arm. Worse if sleeps in bad position  Skin:  Negative for rash.  Allergic/Immunologic: Positive for environmental allergies. Negative for immunocompromised state.       Cetirizine is effective  Neurological:  Negative for dizziness, syncope, light-headedness and headaches.  Hematological:  Negative for adenopathy. Does not bruise/bleed easily.  Psychiatric/Behavioral:  Negative for dysphoric mood and sleep disturbance. The patient is not nervous/anxious.        Objective:   Physical Exam Constitutional:      Appearance: Normal appearance.  HENT:     Mouth/Throat:     Pharynx: No oropharyngeal exudate or posterior oropharyngeal erythema.  Eyes:     Conjunctiva/sclera: Conjunctivae normal.     Pupils: Pupils are equal, round, and reactive to light.  Cardiovascular:     Rate and Rhythm: Normal rate and regular rhythm.     Pulses: Normal pulses.     Heart sounds: No murmur heard.    No gallop.  Pulmonary:     Effort: Pulmonary effort is normal.     Breath sounds: Normal breath sounds. No wheezing or rales.  Abdominal:     Palpations: Abdomen is soft.     Tenderness: There is no abdominal tenderness.  Musculoskeletal:     Cervical back: Neck supple.     Right lower leg: No edema.     Left lower leg: No edema.  Lymphadenopathy:     Cervical: No cervical adenopathy.  Skin:    Findings: No lesion or rash.  Neurological:     General: No focal deficit present.     Mental Status: He is alert and oriented to person, place, and time.  Psychiatric:        Mood and Affect: Mood normal.         Behavior: Behavior normal.            Assessment & Plan:

## 2024-05-20 NOTE — Assessment & Plan Note (Signed)
 BP Readings from Last 3 Encounters:  05/20/24 138/88  05/04/23 136/88  08/06/22 132/75   Controlled with losartan  hydrochlorothiazide  50/12.5

## 2024-05-21 ENCOUNTER — Ambulatory Visit: Payer: Self-pay | Admitting: Internal Medicine

## 2024-05-21 LAB — COMPREHENSIVE METABOLIC PANEL WITH GFR
ALT: 19 U/L (ref 0–53)
AST: 18 U/L (ref 0–37)
Albumin: 4.4 g/dL (ref 3.5–5.2)
Alkaline Phosphatase: 57 U/L (ref 39–117)
BUN: 17 mg/dL (ref 6–23)
CO2: 28 meq/L (ref 19–32)
Calcium: 8.8 mg/dL (ref 8.4–10.5)
Chloride: 103 meq/L (ref 96–112)
Creatinine, Ser: 0.83 mg/dL (ref 0.40–1.50)
GFR: 98.67 mL/min (ref >60.00)
Glucose, Bld: 107 mg/dL — ABNORMAL HIGH (ref 70–99)
Potassium: 3.8 meq/L (ref 3.5–5.1)
Sodium: 140 meq/L (ref 135–145)
Total Bilirubin: 0.6 mg/dL (ref 0.2–1.2)
Total Protein: 6.6 g/dL (ref 6.0–8.3)

## 2024-05-21 LAB — CBC
HCT: 42.4 % (ref 39.0–52.0)
Hemoglobin: 14.3 g/dL (ref 13.0–17.0)
MCHC: 33.8 g/dL (ref 30.0–36.0)
MCV: 89.6 fl (ref 78.0–100.0)
Platelets: 230 K/uL (ref 150.0–400.0)
RBC: 4.72 Mil/uL (ref 4.22–5.81)
RDW: 13.7 % (ref 11.5–15.5)
WBC: 5.5 K/uL (ref 4.0–10.5)

## 2024-05-21 LAB — LIPID PANEL
Cholesterol: 206 mg/dL — ABNORMAL HIGH (ref 0–200)
HDL: 66.9 mg/dL (ref >39.00)
LDL Cholesterol: 116 mg/dL — ABNORMAL HIGH (ref 0–99)
NonHDL: 139.58
Total CHOL/HDL Ratio: 3
Triglycerides: 119 mg/dL (ref 0.0–149.0)
VLDL: 23.8 mg/dL (ref 0.0–40.0)

## 2024-05-21 LAB — PSA: PSA: 1.67 ng/mL (ref 0.10–4.00)

## 2024-05-21 LAB — TSH: TSH: 1.61 u[IU]/mL (ref 0.35–5.50)

## 2024-05-21 LAB — T4, FREE: Free T4: 0.99 ng/dL (ref 0.60–1.60)

## 2024-05-27 ENCOUNTER — Other Ambulatory Visit: Payer: Self-pay | Admitting: Internal Medicine

## 2024-07-16 ENCOUNTER — Other Ambulatory Visit: Payer: Self-pay

## 2024-07-16 MED ORDER — LEVOTHYROXINE SODIUM 175 MCG PO TABS: 175.0000 ug | ORAL_TABLET | Freq: Every day | ORAL | 3 refills | Status: AC

## 2024-07-16 NOTE — Telephone Encounter (Signed)
 Rx sent electronically.

## 2024-08-06 ENCOUNTER — Other Ambulatory Visit: Payer: Self-pay | Admitting: *Deleted

## 2024-08-06 MED ORDER — LOSARTAN POTASSIUM-HCTZ 50-12.5 MG PO TABS: 1.0000 | ORAL_TABLET | Freq: Every day | ORAL | 1 refills | Status: AC

## 2024-08-19 ENCOUNTER — Encounter: Payer: Self-pay | Admitting: Internal Medicine

## 2025-05-26 ENCOUNTER — Encounter
